# Patient Record
Sex: Male | Born: 1990 | Race: Black or African American | Hispanic: No | Marital: Single | Smoking: Former smoker
Health system: Southern US, Community
[De-identification: ages and names within clinical notes are randomized; demographics above are authoritative.]

## PROBLEM LIST (undated history)

## (undated) DIAGNOSIS — F32A Depression, unspecified: Secondary | ICD-10-CM

## (undated) DIAGNOSIS — F3132 Bipolar disorder, current episode depressed, moderate: Secondary | ICD-10-CM

## (undated) DIAGNOSIS — R45851 Suicidal ideations: Secondary | ICD-10-CM

## (undated) DIAGNOSIS — F419 Anxiety disorder, unspecified: Secondary | ICD-10-CM

## (undated) DIAGNOSIS — F329 Major depressive disorder, single episode, unspecified: Secondary | ICD-10-CM

## (undated) DIAGNOSIS — F609 Personality disorder, unspecified: Secondary | ICD-10-CM

## (undated) DIAGNOSIS — F319 Bipolar disorder, unspecified: Secondary | ICD-10-CM

## (undated) DIAGNOSIS — R51 Headache: Secondary | ICD-10-CM

## (undated) DIAGNOSIS — G8929 Other chronic pain: Secondary | ICD-10-CM

## (undated) DIAGNOSIS — M549 Dorsalgia, unspecified: Secondary | ICD-10-CM

## (undated) HISTORY — DX: Major depressive disorder, single episode, unspecified: F32.9

## (undated) HISTORY — DX: Depression, unspecified: F32.A

## (undated) HISTORY — DX: Bipolar disorder, current episode depressed, moderate: F31.32

## (undated) HISTORY — PX: ESOPHAGUS SURGERY: SHX626

## (undated) HISTORY — DX: Anxiety disorder, unspecified: F41.9

## (undated) HISTORY — DX: Suicidal ideations: R45.851

## (undated) HISTORY — DX: Headache: R51

## (undated) HISTORY — PX: RIB FRACTURE SURGERY: SHX2358

---

## 2011-02-03 ENCOUNTER — Emergency Department (HOSPITAL_COMMUNITY)
Admission: EM | Admit: 2011-02-03 | Discharge: 2011-02-04 | Disposition: A | Discharge status: Home / Self Care | Attending: Emergency Medicine | Admitting: Emergency Medicine

## 2011-02-03 DIAGNOSIS — F17200 Nicotine dependence, unspecified, uncomplicated: Secondary | ICD-10-CM | POA: Insufficient documentation

## 2011-02-03 DIAGNOSIS — J45909 Unspecified asthma, uncomplicated: Secondary | ICD-10-CM | POA: Insufficient documentation

## 2011-02-03 DIAGNOSIS — J029 Acute pharyngitis, unspecified: Secondary | ICD-10-CM

## 2011-02-03 LAB — RAPID STREP SCREEN (MED CTR MEBANE ONLY): Streptococcus, Group A Screen (Direct): NEGATIVE

## 2011-02-03 MED ORDER — TRAMADOL HCL 50 MG PO TABS: 100.0000 mg | ORAL_TABLET | Freq: Four times a day (QID) | ORAL | Status: DC

## 2011-02-03 MED ORDER — IBUPROFEN 800 MG PO TABS
800.0000 mg | ORAL_TABLET | Freq: Once | ORAL | Status: AC
  Administered 2011-02-03: 800 mg via ORAL
  Filled 2011-02-03: qty 1

## 2011-02-03 MED ORDER — TRAMADOL HCL 50 MG PO TABS: 100.0000 mg | ORAL_TABLET | Freq: Four times a day (QID) | ORAL | Status: AC

## 2011-02-03 MED ORDER — TRAMADOL HCL 50 MG PO TABS
100.0000 mg | ORAL_TABLET | Freq: Four times a day (QID) | ORAL | Status: DC
  Administered 2011-02-04: 100 mg via ORAL
  Filled 2011-02-03: qty 2

## 2011-02-03 NOTE — ED Notes (Signed)
Patient is resting comfortably. 

## 2011-02-03 NOTE — ED Provider Notes (Signed)
History     Chief Complaint  Patient presents with  . Sore Throat   Patient is a 20 y.o. male presenting with pharyngitis. The history is provided by the patient. No language interpreter was used.  Sore Throat This is a new problem. The current episode started in the past 7 days. The problem occurs constantly. The problem has been gradually worsening. Associated symptoms include a sore throat. The symptoms are aggravated by swallowing. He has tried nothing for the symptoms.  Sore Throat This is a new problem. The current episode started in the past 7 days. The problem occurs constantly. The problem has been gradually worsening. The symptoms are aggravated by swallowing. He has tried nothing for the symptoms.    Past Medical History  Diagnosis Date  . Asthma     History reviewed. No pertinent past surgical history.  History reviewed. No pertinent family history.  History  Substance Use Topics  . Smoking status: Current Everyday Smoker  . Smokeless tobacco: Not on file  . Alcohol Use: No      Review of Systems  HENT: Positive for sore throat.   All other systems reviewed and are negative.    Physical Exam  BP 97/60  Pulse 98  Temp(Src) 101 F (38.3 C) (Oral)  Resp 17  Ht 5\' 10"  (1.778 m)  Wt 130 lb (58.968 kg)  BMI 18.65 kg/m2  SpO2 96%  Physical Exam  Nursing note and vitals reviewed. Constitutional: He is oriented to person, place, and time. Vital signs are normal. He appears well-developed and well-nourished.  HENT:  Head: Normocephalic and atraumatic.  Right Ear: External ear normal.  Left Ear: External ear normal.  Nose: Nose normal.  Mouth/Throat: Uvula is midline and mucous membranes are normal. No oropharyngeal exudate, posterior oropharyngeal edema, posterior oropharyngeal erythema or tonsillar abscesses.  Eyes: Conjunctivae and EOM are normal. Pupils are equal, round, and reactive to light. Right eye exhibits no discharge. Left eye exhibits no  discharge. No scleral icterus.  Neck: Normal range of motion. Neck supple. No JVD present. No tracheal deviation present. No thyromegaly present.  Cardiovascular: Normal rate, regular rhythm, normal heart sounds, intact distal pulses and normal pulses.  Exam reveals no gallop and no friction rub.   No murmur heard. Pulmonary/Chest: Effort normal and breath sounds normal. No stridor. No respiratory distress. He has no wheezes. He has no rales. He exhibits no tenderness.  Abdominal: Soft. Normal appearance and bowel sounds are normal. He exhibits no distension and no mass. There is no tenderness. There is no rebound and no guarding.  Musculoskeletal: Normal range of motion. He exhibits no edema and no tenderness.  Lymphadenopathy:    He has no cervical adenopathy.  Neurological: He is alert and oriented to person, place, and time. He has normal reflexes. No cranial nerve deficit. Coordination normal. GCS eye subscore is 4. GCS verbal subscore is 5. GCS motor subscore is 6.  Reflex Scores:      Tricep reflexes are 2+ on the right side and 2+ on the left side.      Bicep reflexes are 2+ on the right side and 2+ on the left side.      Brachioradialis reflexes are 2+ on the right side and 2+ on the left side.      Patellar reflexes are 2+ on the right side and 2+ on the left side.      Achilles reflexes are 2+ on the right side and 2+ on the left side.  Skin: Skin is warm and dry. No rash noted. He is not diaphoretic.  Psychiatric: He has a normal mood and affect. His speech is normal and behavior is normal. Judgment and thought content normal. Cognition and memory are normal.    ED Course  Procedures  MDM       Worthy Rancher, PA 02/12/11 1634  Worthy Rancher, PA 02/12/11 1635  Medical screening examination/treatment/procedure(s) were performed by non-physician practitioner and as supervising physician I was immediately available for consultation/collaboration.  Nicoletta Dress. Colon Branch,  MD 02/17/11 [phone]

## 2011-02-03 NOTE — ED Notes (Signed)
Sore throat x 2 days

## 2011-06-08 ENCOUNTER — Other Ambulatory Visit (INDEPENDENT_AMBULATORY_CARE_PROVIDER_SITE_OTHER)

## 2011-06-08 ENCOUNTER — Encounter: Payer: Self-pay | Admitting: Internal Medicine

## 2011-06-08 ENCOUNTER — Ambulatory Visit (INDEPENDENT_AMBULATORY_CARE_PROVIDER_SITE_OTHER): Admitting: Internal Medicine

## 2011-06-08 VITALS — BP 112/78 | HR 63 | Temp 98.4°F | Resp 16 | Ht 70.0 in | Wt 134.0 lb

## 2011-06-08 DIAGNOSIS — Z0000 Encounter for general adult medical examination without abnormal findings: Secondary | ICD-10-CM

## 2011-06-08 DIAGNOSIS — Z23 Encounter for immunization: Secondary | ICD-10-CM

## 2011-06-08 LAB — CBC WITH DIFFERENTIAL/PLATELET
Basophils Relative: 0.3 % (ref 0.0–3.0)
Eosinophils Absolute: 0.4 10*3/uL (ref 0.0–0.7)
MCHC: 33.5 g/dL (ref 30.0–36.0)
MCV: 85.2 fl (ref 78.0–100.0)
Monocytes Absolute: 1 10*3/uL (ref 0.1–1.0)
Neutrophils Relative %: 75.6 % (ref 43.0–77.0)
Platelets: 268 10*3/uL (ref 150.0–400.0)
RDW: 13.2 % (ref 11.5–14.6)

## 2011-06-08 LAB — URINALYSIS, ROUTINE W REFLEX MICROSCOPIC
Bilirubin Urine: NEGATIVE
Nitrite: NEGATIVE
Total Protein, Urine: NEGATIVE
pH: 7 (ref 5.0–8.0)

## 2011-06-08 LAB — COMPREHENSIVE METABOLIC PANEL
AST: 32 U/L (ref 0–37)
Albumin: 4.5 g/dL (ref 3.5–5.2)
Alkaline Phosphatase: 103 U/L (ref 39–117)
BUN: 12 mg/dL (ref 6–23)
Potassium: 4.6 mEq/L (ref 3.5–5.1)
Sodium: 142 mEq/L (ref 135–145)

## 2011-06-08 LAB — LIPID PANEL
Cholesterol: 171 mg/dL (ref 0–200)
HDL: 60.4 mg/dL (ref >39.00)
VLDL: 10.4 mg/dL (ref 0.0–40.0)

## 2011-06-08 NOTE — Assessment & Plan Note (Signed)
Exam done, labs ordered, vaccines were updated, pt ed material was given 

## 2011-06-08 NOTE — Progress Notes (Signed)
  Subjective:    Patient ID: Robert Benton, male    DOB: [birth year], 20 y.o.   MRN: [id number]  HPI  New to me for a complete physical, he offers no complaints.  Review of Systems  Constitutional: Negative.   HENT: Negative.   Eyes: Negative.   Respiratory: Negative.   Cardiovascular: Negative.   Gastrointestinal: Negative.   Genitourinary: Negative.   Musculoskeletal: Negative.   Skin: Negative.   Neurological: Negative.   Hematological: Negative.   Psychiatric/Behavioral: Negative.        Objective:   Physical Exam  Vitals reviewed. Constitutional: He is oriented to person, place, and time. He appears well-developed and well-nourished. No distress.  HENT:  Head: Normocephalic and atraumatic.  Mouth/Throat: Oropharynx is clear and moist. No oropharyngeal exudate.  Eyes: Conjunctivae are normal. Right eye exhibits no discharge. Left eye exhibits no discharge. No scleral icterus.  Neck: Normal range of motion. Neck supple. No JVD present. No tracheal deviation present. No thyromegaly present.  Cardiovascular: Normal rate, regular rhythm, normal heart sounds and intact distal pulses.  Exam reveals no gallop and no friction rub.   No murmur heard. Pulmonary/Chest: Effort normal and breath sounds normal. No stridor. No respiratory distress. He has no wheezes. He has no rales. He exhibits no tenderness.  Abdominal: Soft. Bowel sounds are normal. He exhibits no distension and no mass. There is no tenderness. There is no rebound and no guarding. Hernia confirmed negative in the right inguinal area and confirmed negative in the left inguinal area.  Genitourinary: Testes normal and penis normal. Right testis shows no mass, no swelling and no tenderness. Right testis is descended. Left testis shows no mass, no swelling and no tenderness. Left testis is descended. Circumcised. No penile tenderness. No discharge found.  Musculoskeletal: Normal range of motion. He exhibits no edema and no  tenderness.  Lymphadenopathy:    He has no cervical adenopathy.       Right: No inguinal adenopathy present.       Left: No inguinal adenopathy present.  Neurological: He is oriented to person, place, and time.  Skin: Skin is warm and dry. No rash noted. He is not diaphoretic. No erythema. No pallor.  Psychiatric: He has a normal mood and affect. His behavior is normal. Judgment and thought content normal.          Assessment & Plan:

## 2011-06-08 NOTE — Patient Instructions (Signed)
Health Maintenance, Males A healthy lifestyle and preventative care can promote health and wellness.  Maintain regular health, dental, and eye exams.   Eat a healthy diet. Foods like vegetables, fruits, whole grains, low-fat dairy products, and lean protein foods contain the nutrients you need without too many calories. Decrease your intake of foods high in solid fats, added sugars, and salt. Get information about a proper diet from your caregiver, if necessary.   Regular physical exercise is one of the most important things you can do for your health. Most adults should get at least 150 minutes of moderate-intensity exercise (any activity that increases your heart rate and causes you to sweat) each week. In addition, most adults need muscle-strengthening exercises on 2 or more days a week.    Maintain a healthy weight. The body mass index (BMI) is a screening tool to identify possible weight problems. It provides an estimate of body fat based on height and weight. Your caregiver can help determine your BMI, and can help you achieve or maintain a healthy weight. For adults 20 years and older:   A BMI below 18.5 is considered underweight.   A BMI of 18.5 to 24.9 is normal.   A BMI of 25 to 29.9 is considered overweight.   A BMI of 30 and above is considered obese.   Maintain normal blood lipids and cholesterol by exercising and minimizing your intake of saturated fat. Eat a balanced diet with plenty of fruits and vegetables. Blood tests for lipids and cholesterol should begin at age 20 and be repeated every 5 years. If your lipid or cholesterol levels are high, you are over 50, or you are a high risk for heart disease, you may need your cholesterol levels checked more frequently.Ongoing high lipid and cholesterol levels should be treated with medicines, if diet and exercise are not effective.   If you smoke, find out from your caregiver how to quit. If you do not use tobacco, do not start.    If you choose to drink alcohol, do not exceed 2 drinks per day. One drink is considered to be 12 ounces (355 mL) of beer, 5 ounces (148 mL) of wine, or 1.5 ounces (44 mL) of liquor.   Avoid use of street drugs. Do not share needles with anyone. Ask for help if you need support or instructions about stopping the use of drugs.   High blood pressure causes heart disease and increases the risk of stroke. Blood pressure should be checked at least every 1 to 2 years. Ongoing high blood pressure should be treated with medicines if weight loss and exercise are not effective.   If you are 45 to 19 years old, ask your caregiver if you should take aspirin to prevent heart disease.   Diabetes screening involves taking a blood sample to check your fasting blood sugar level. This should be done once every 3 years, after age 45, if you are within normal weight and without risk factors for diabetes. Testing should be considered at a younger age or be carried out more frequently if you are overweight and have at least 1 risk factor for diabetes.   Colorectal cancer can be detected and often prevented. Most routine colorectal cancer screening begins at the age of 50 and continues through age 75. However, your caregiver may recommend screening at an earlier age if you have risk factors for colon cancer. On a yearly basis, your caregiver may provide home test kits to check for hidden   blood in the stool. Use of a small camera at the end of a tube, to directly examine the colon (sigmoidoscopy or colonoscopy), can detect the earliest forms of colorectal cancer. Talk to your caregiver about this at age 50, when routine screening begins. Direct examination of the colon should be repeated every 5 to 10 years through age 75, unless early forms of pre-cancerous polyps or small growths are found.   Healthy men should no longer receive prostate-specific antigen (PSA) blood tests as part of routine cancer screening. Consult with  your caregiver about prostate cancer screening.   Practice safe sex. Use condoms and avoid high-risk sexual practices to reduce the spread of sexually transmitted infections (STIs).   Use sunscreen with a sun protection factor (SPF) of 30 or greater. Apply sunscreen liberally and repeatedly throughout the day. You should seek shade when your shadow is shorter than you. Protect yourself by wearing long sleeves, pants, a wide-brimmed hat, and sunglasses year round, whenever you are outdoors.   Notify your caregiver of new moles or changes in moles, especially if there is a change in shape or color. Also notify your caregiver if a mole is larger than the size of a pencil eraser.   A one-time screening for abdominal aortic aneurysm (AAA) and surgical repair of large AAAs by sound wave imaging (ultrasonography) is recommended for ages 65 to 75 years who are current or former smokers.   Stay current with your immunizations.  Document Released: 01/05/2008 Document Revised: 03/21/2011 Document Reviewed: 12/04/2010 [employer] Patient Information 2012 [employer]. 

## 2011-06-10 ENCOUNTER — Encounter: Payer: Self-pay | Admitting: Internal Medicine

## 2011-06-11 ENCOUNTER — Telehealth: Payer: Self-pay

## 2011-06-11 NOTE — Telephone Encounter (Signed)
What is the diagnosis? Is there an option for this in the EPIC software?

## 2011-06-11 NOTE — Telephone Encounter (Signed)
Patient stopped by office requesting referral to [facility]. Thanks

## 2011-06-11 NOTE — Telephone Encounter (Signed)
Returned call to patient to advise pt appt must be selfmade

## 2012-11-23 ENCOUNTER — Encounter (HOSPITAL_COMMUNITY): Payer: Self-pay

## 2012-11-23 ENCOUNTER — Emergency Department (HOSPITAL_COMMUNITY)
Admission: EM | Admit: 2012-11-23 | Discharge: 2012-11-23 | Disposition: A | Discharge status: Home / Self Care | Payer: Self-pay | Attending: Emergency Medicine | Admitting: Emergency Medicine

## 2012-11-23 ENCOUNTER — Emergency Department (HOSPITAL_COMMUNITY): Payer: Self-pay

## 2012-11-23 DIAGNOSIS — Y9289 Other specified places as the place of occurrence of the external cause: Secondary | ICD-10-CM | POA: Insufficient documentation

## 2012-11-23 DIAGNOSIS — M549 Dorsalgia, unspecified: Secondary | ICD-10-CM | POA: Insufficient documentation

## 2012-11-23 DIAGNOSIS — Y9389 Activity, other specified: Secondary | ICD-10-CM | POA: Insufficient documentation

## 2012-11-23 DIAGNOSIS — R296 Repeated falls: Secondary | ICD-10-CM | POA: Insufficient documentation

## 2012-11-23 DIAGNOSIS — F17200 Nicotine dependence, unspecified, uncomplicated: Secondary | ICD-10-CM | POA: Insufficient documentation

## 2012-11-23 DIAGNOSIS — R55 Syncope and collapse: Secondary | ICD-10-CM | POA: Insufficient documentation

## 2012-11-23 DIAGNOSIS — Z8679 Personal history of other diseases of the circulatory system: Secondary | ICD-10-CM | POA: Insufficient documentation

## 2012-11-23 DIAGNOSIS — S0990XA Unspecified injury of head, initial encounter: Secondary | ICD-10-CM | POA: Insufficient documentation

## 2012-11-23 DIAGNOSIS — J45909 Unspecified asthma, uncomplicated: Secondary | ICD-10-CM | POA: Insufficient documentation

## 2012-11-23 DIAGNOSIS — Z8659 Personal history of other mental and behavioral disorders: Secondary | ICD-10-CM | POA: Insufficient documentation

## 2012-11-23 LAB — CBC WITH DIFFERENTIAL/PLATELET
Hemoglobin: 15.2 g/dL (ref 13.0–17.0)
Lymphocytes Relative: 26 % (ref 12–46)
Lymphs Abs: 3.1 10*3/uL (ref 0.7–4.0)
MCH: 28.8 pg (ref 26.0–34.0)
Monocytes Relative: 6 % (ref 3–12)
Neutro Abs: 7.4 10*3/uL (ref 1.7–7.7)
Neutrophils Relative %: 62 % (ref 43–77)
RBC: 5.28 MIL/uL (ref 4.22–5.81)
WBC: 12 10*3/uL — ABNORMAL HIGH (ref 4.0–10.5)

## 2012-11-23 LAB — COMPREHENSIVE METABOLIC PANEL
ALT: 36 U/L (ref 0–53)
Alkaline Phosphatase: 98 U/L (ref 39–117)
BUN: 10 mg/dL (ref 6–23)
CO2: 28 mEq/L (ref 19–32)
Chloride: 103 mEq/L (ref 96–112)
GFR calc Af Amer: >90 mL/min (ref >90)
Glucose, Bld: 92 mg/dL (ref 70–99)
Potassium: 3.9 mEq/L (ref 3.5–5.1)
Total Bilirubin: 0.3 mg/dL (ref 0.3–1.2)

## 2012-11-23 LAB — URINALYSIS, ROUTINE W REFLEX MICROSCOPIC
Bilirubin Urine: NEGATIVE
Ketones, ur: NEGATIVE mg/dL
Leukocytes, UA: NEGATIVE
Nitrite: NEGATIVE
Protein, ur: NEGATIVE mg/dL

## 2012-11-23 LAB — RAPID URINE DRUG SCREEN, HOSP PERFORMED
Amphetamines: NOT DETECTED
Barbiturates: NOT DETECTED

## 2012-11-23 LAB — TROPONIN I: Troponin I: <0.3 ng/mL (ref <0.30)

## 2012-11-23 MED ORDER — SODIUM CHLORIDE 0.9 % IV BOLUS (SEPSIS)
1000.0000 mL | Freq: Once | INTRAVENOUS | Status: AC
  Administered 2012-11-23: 1000 mL via INTRAVENOUS

## 2012-11-23 NOTE — ED Notes (Signed)
Pt presents with c/o dizziness with LOC, and lower back pain that radiates upward to mid thoracic area.  Pt refers to the LOC as, room spins, feels anxious, difficulty breathing  " then passes out". Awakens on the floor. Pt reports has a lot of stress in his life right now. NAD noted at this time. Pt is alert and oriented x 4.

## 2012-11-23 NOTE — ED Notes (Signed)
1. Dizziness started on Thursday, stood and fell "i passed out then and fell", stated he laid on floor for 10 minutes and then got up and drank some water.  Was not seen at that time.  Headache since falling, +loc per pt, denies any n/v since fall. 2. Back pain for 2 years, has been seen by multiple doctors. Denies any new or recent injury to back.

## 2012-11-23 NOTE — ED Provider Notes (Signed)
History  This chart was scribed for Robert Octave, MD by Robert Benton, ED Scribe. The patient was seen in room APA14/APA14. Patient's care was started at 1425.   CSN: [id number]  Arrival date & time 11/23/12  1402   First MD Initiated Contact with Patient 11/23/12 1425      Chief Complaint  Patient presents with  . Dizziness  . Back Pain  . Headache     The history is provided by the patient. No language interpreter was used.     HPI Comments: Robert Benton is a 22 y.o. male who presents to the Emergency Department complaining of persistent dizziness since Thursday (3 days ago). He describes the sensation both as like the room is spinning and like he is off balance at different times. Patient also states that he had a brief syncopal episode on Thursday. He reports that he fell on the floor and laid there for 10 minutes before getting up and having some water. He says that he has had a diffuse headache since falling. Patient was not evaluated immediately after the fall. He states that he came to the ED today because he has continued to feel ill. Patient denies nausea, chest pain, shortness of breath, abdominal pain, dysuria, decreased intake or other symptoms. Patient states that he is eating and drinking well. Patient denies history of cardiac disease. He reports family history of MI (uncles in 30s). He states that one of his cousins also died suddenly. He smokes cigarettes and uses marijuana. His last marijuana use was one week ago.  Past Medical History  Diagnosis Date  . Asthma   . Depression   . Headache     Migranes    History reviewed. No pertinent past surgical history.  Family History  Problem Relation Age of Onset  . Stroke Paternal Grandfather     grandparents  . Heart disease Other     grandparents  . Hypertension Other     grandparents and other relative  . Kidney disease Other     grandparents  . Diabetes Other     grandparents  . Asthma Other      grandparents  . Cancer Neg Hx     History  Substance Use Topics  . Smoking status: Current Every Day Smoker    Types: Cigarettes  . Smokeless tobacco: Not on file  . Alcohol Use: No      Review of Systems A complete 10 system review of systems was obtained and all systems are negative except as noted in the HPI and PMH.   Allergies  Review of patient's allergies indicates no known allergies.  Home Medications  No current outpatient prescriptions on file.  Triage Vitals: BP 136/87  Pulse 88  Temp(Src) 98.2 F (36.8 C) (Oral)  Resp 17  Ht 5\' 10"  (1.778 m)  Wt 134 lb (60.782 kg)  BMI 19.23 kg/m2  SpO2 97%  Physical Exam  Constitutional: He appears well-developed and well-nourished.  HENT:  Head: Normocephalic and atraumatic.  Mouth/Throat: Oropharynx is clear and moist. Mucous membranes are dry.  Eyes: Conjunctivae and EOM are normal. Pupils are equal, round, and reactive to light.  No nystagmus.   Neck: Normal range of motion. Neck supple.  Cardiovascular: Normal rate, regular rhythm and normal heart sounds.   Pulmonary/Chest: Effort normal and breath sounds normal.  Abdominal: Soft. There is no tenderness.  Musculoskeletal: Normal range of motion.  Neurological: No cranial nerve deficit. He exhibits normal muscle tone. Coordination normal.  5/5 strength throughout.  Skin: Skin is warm and dry.  Psychiatric: He has a normal mood and affect. His behavior is normal.    ED Course  Procedures (including critical care time) DIAGNOSTIC STUDIES: Oxygen Saturation is 97% on room air, adequate by my interpretation.    COORDINATION OF CARE: 2:27 PM- Patient informed of current plan for treatment and evaluation and agrees with plan at this time.      Labs Reviewed  CBC WITH DIFFERENTIAL - Abnormal; Notable for the following:    WBC 12.0 (*)    MCHC 36.5 (*)    Eosinophils Relative 6 (*)    All other components within normal limits  URINE RAPID DRUG SCREEN (HOSP  PERFORMED) - Abnormal; Notable for the following:    Tetrahydrocannabinol POSITIVE (*)    All other components within normal limits  COMPREHENSIVE METABOLIC PANEL  URINALYSIS, ROUTINE W REFLEX MICROSCOPIC  TROPONIN I   Ct Head Wo Contrast  11/23/2012  *RADIOLOGY REPORT*  Clinical Data: Dizziness with headaches for a few days.  CT HEAD WITHOUT CONTRAST  Technique:  Contiguous axial images were obtained from the base of the skull through the vertex without contrast.  Comparison: None.  Findings: There is no evidence of acute intracranial hemorrhage, mass lesion, brain edema or extra-axial fluid collection.  The ventricles and subarachnoid spaces are appropriately sized for age. There is no CT evidence of acute cortical infarction.  The visualized paranasal sinuses are clear. The calvarium is intact.  IMPRESSION: Unremarkable noncontrast head CT.   Original Report Authenticated By: Robert Benton, M.D.      1. Syncope       MDM  3 days of dizziness and lightheadedness with syncopal episode 3 years ago. Uncertain loss of consciousness. No nausea, vomiting, diarrhea. No chest pain shortness of breath. No palpitations.  EKG showed normal sinus rhythm without arrhythmia. Orthostatics are negative. Laboratories unremarkable.  Patient is at his baseline, tolerating by mouth ambulatory. Suspect probable vasovagal syncope. He is low risk for adverse outcome. [place] syncope rules negative. Advised to stop smoking marijuana.   Date: 11/23/2012  Rate: 84  Rhythm: normal sinus rhythm  QRS Axis: normal  Intervals: normal  ST/T Wave abnormalities: normal  Conduction Disutrbances:none  Narrative Interpretation: No brugada, no prolonged QT  Old EKG Reviewed: none available       I personally performed the services described in this documentation, which was scribed in my presence. The recorded information has been reviewed and is accurate.    Robert Octave, MD 11/23/12 [phone]

## 2012-11-26 ENCOUNTER — Encounter (HOSPITAL_COMMUNITY): Payer: Self-pay

## 2012-11-26 ENCOUNTER — Emergency Department (HOSPITAL_COMMUNITY)
Admission: EM | Admit: 2012-11-26 | Discharge: 2012-11-26 | Disposition: A | Discharge status: Home / Self Care | Payer: Self-pay | Attending: Emergency Medicine | Admitting: Emergency Medicine

## 2012-11-26 DIAGNOSIS — Z79899 Other long term (current) drug therapy: Secondary | ICD-10-CM | POA: Insufficient documentation

## 2012-11-26 DIAGNOSIS — M545 Low back pain, unspecified: Secondary | ICD-10-CM | POA: Insufficient documentation

## 2012-11-26 DIAGNOSIS — J40 Bronchitis, not specified as acute or chronic: Secondary | ICD-10-CM

## 2012-11-26 DIAGNOSIS — R079 Chest pain, unspecified: Secondary | ICD-10-CM | POA: Insufficient documentation

## 2012-11-26 DIAGNOSIS — Z8659 Personal history of other mental and behavioral disorders: Secondary | ICD-10-CM | POA: Insufficient documentation

## 2012-11-26 DIAGNOSIS — J3489 Other specified disorders of nose and nasal sinuses: Secondary | ICD-10-CM | POA: Insufficient documentation

## 2012-11-26 DIAGNOSIS — F17200 Nicotine dependence, unspecified, uncomplicated: Secondary | ICD-10-CM | POA: Insufficient documentation

## 2012-11-26 DIAGNOSIS — G8929 Other chronic pain: Secondary | ICD-10-CM | POA: Insufficient documentation

## 2012-11-26 DIAGNOSIS — Z8679 Personal history of other diseases of the circulatory system: Secondary | ICD-10-CM | POA: Insufficient documentation

## 2012-11-26 DIAGNOSIS — J45909 Unspecified asthma, uncomplicated: Secondary | ICD-10-CM | POA: Insufficient documentation

## 2012-11-26 MED ORDER — ALBUTEROL SULFATE HFA 108 (90 BASE) MCG/ACT IN AERS
2.0000 | INHALATION_SPRAY | RESPIRATORY_TRACT | Status: DC | PRN
  Administered 2012-11-26: 2 via RESPIRATORY_TRACT
  Filled 2012-11-26: qty 6.7

## 2012-11-26 MED ORDER — GUAIFENESIN-CODEINE 100-10 MG/5ML PO SYRP: 5.0000 mL | ORAL_SOLUTION | Freq: Three times a day (TID) | ORAL | Status: DC | PRN

## 2012-11-26 NOTE — ED Notes (Signed)
Pt reports has had a cough and chest pain x 2 days.  Also reports has chronic lower back pain.  Pt says upper back also hurts.  Pt also says has been having a lot anxiety lately.

## 2012-11-26 NOTE — ED Notes (Signed)
Patient states that he has been having chest tightness with a dry cough for the past 2 days.  States that his symptoms do feel similar to anxiety attacks that he has had in the past.  The patient reports being admitted for psychiatric problems in 2011 with no follow up since then.  Reports that he was taking trileptal then and felt much better, however, the patient reports that he was not directed to follow up with a doctor for continued psychiatric care.

## 2012-11-26 NOTE — ED Notes (Signed)
Respiratory therapy paged for treatment 

## 2012-11-26 NOTE — ED Provider Notes (Signed)
History     CSN: [id number]  Arrival date & time 11/26/12  4098   First MD Initiated Contact with Patient 11/26/12 0930      Chief Complaint  Patient presents with  . Cough  . Back Pain    (Consider location/radiation/quality/duration/timing/severity/associated sxs/prior treatment) Patient is a 22 y.o. male presenting with cough and back pain. The history is provided by the patient.  Cough Cough characteristics:  Non-productive Severity:  Moderate Onset quality:  Gradual Duration:  2 days Timing:  Sporadic Progression:  Unchanged Chronicity:  New Smoker: yes   Relieved by:  Rest Ineffective treatments:  None tried Associated symptoms: no chills, no ear pain, no fever, no headaches, no myalgias, no rash, no sinus congestion and no sore throat  Chest pain: only with cough.   Back Pain Associated symptoms: no fever and no headaches  Chest pain: only with cough.    Robert Benton is a 22 y.o. male who presents to the ED with cough and cold symptoms that started 2 days ago. He has not tried any OTC medication. His chest hurts after he has coughed a lot. Hx of asthma as a child. Symptoms worse since pollen is worse. He denies fever, chills, nausea or vomiting. He was here last week and had complete work up for syncopal episodes. He is going to follow up with Dr. Loleta Chance as planned. The history was provided by the patient and his mother. Past Medical History  Diagnosis Date  . Asthma   . Depression   . Headache     Migranes    History reviewed. No pertinent past surgical history.  Family History  Problem Relation Age of Onset  . Stroke Paternal Grandfather     grandparents  . Heart disease Other     grandparents  . Hypertension Other     grandparents and other relative  . Kidney disease Other     grandparents  . Diabetes Other     grandparents  . Asthma Other     grandparents  . Cancer Neg Hx     History  Substance Use Topics  . Smoking status: Current Every Day  Smoker    Types: Cigarettes  . Smokeless tobacco: Not on file  . Alcohol Use: No      Review of Systems  Constitutional: Negative for fever and chills.  HENT: Negative for ear pain and sore throat.   Respiratory: Positive for cough.   Cardiovascular: Chest pain: only with cough.  Gastrointestinal: Negative for nausea and vomiting.  Musculoskeletal: Positive for back pain. Negative for myalgias.  Skin: Negative for rash.  Neurological: Negative for headaches.  Psychiatric/Behavioral: The patient is not nervous/anxious.     Allergies  Review of patient's allergies indicates no known allergies.  Home Medications   Current Outpatient Rx  Name  Route  Sig  Dispense  Refill  . albuterol (PROVENTIL HFA;VENTOLIN HFA) 108 (90 BASE) MCG/ACT inhaler   Inhalation   Inhale 2 puffs into the lungs every 6 (six) hours as needed for wheezing.           BP 128/91  Temp(Src) 98.2 F (36.8 C) (Oral)  Resp 18  Ht 5\' 10"  (1.778 m)  Wt 134 lb (60.782 kg)  BMI 19.23 kg/m2  SpO2 99%  Physical Exam  Constitutional: He is oriented to person, place, and time. He appears well-developed and well-nourished. No distress.  HENT:  Head: Normocephalic.  Right Ear: Tympanic membrane normal.  Left Ear: Tympanic membrane normal.  Nose: Rhinorrhea present. Right sinus exhibits no maxillary sinus tenderness and no frontal sinus tenderness. Left sinus exhibits no maxillary sinus tenderness and no frontal sinus tenderness.  Mouth/Throat: Uvula is midline and mucous membranes are normal. Posterior oropharyngeal erythema: mild.  Eyes: Conjunctivae and EOM are normal. Pupils are equal, round, and reactive to light.  Neck: Normal range of motion. Neck supple.  Cardiovascular: Normal rate, regular rhythm and normal heart sounds.   Pulmonary/Chest: Effort normal. Wheezes: occasional. He exhibits tenderness (with palpation).  Abdominal: Soft. Bowel sounds are normal. There is no tenderness.  Musculoskeletal:  Normal range of motion.  Neurological: He is alert and oriented to person, place, and time.  Skin: Skin is warm and dry.  Psychiatric: He has a normal mood and affect. His behavior is normal. Judgment and thought content normal.   Assessment: 22 y.o. male with nonproductive cough   Bronchospasm  Plan:  Cough medication, albuterol inhaler   Follow up with PCP as planned, return as needed ED Course  Procedures (including critical care time) I have reviewed this patient's vital signs, nurses notes and discussed clinical findings with the patient and plan of care. Patient voices understanding   Medication List    TAKE these medications       guaiFENesin-codeine 100-10 MG/5ML syrup  Commonly known as:  ROBITUSSIN AC  Take 5 mLs by mouth 3 (three) times daily as needed for cough.      ASK your doctor about these medications       albuterol 108 (90 BASE) MCG/ACT inhaler  Commonly known as:  PROVENTIL HFA;VENTOLIN HFA  Inhale 2 puffs into the lungs every 6 (six) hours as needed for wheezing.         MDM          Janne Napoleon, NP 11/26/12 [phone]

## 2012-11-30 NOTE — ED Provider Notes (Signed)
Medical screening examination/treatment/procedure(s) were performed by non-physician practitioner and as supervising physician I was immediately available for consultation/collaboration.   Shelda Jakes, MD 11/30/12 [phone]

## 2012-12-24 ENCOUNTER — Emergency Department (HOSPITAL_COMMUNITY)
Admission: EM | Admit: 2012-12-24 | Discharge: 2012-12-24 | Disposition: A | Discharge status: Home / Self Care | Payer: Managed Care, Other (non HMO) | Attending: Emergency Medicine | Admitting: Emergency Medicine

## 2012-12-24 ENCOUNTER — Encounter (HOSPITAL_COMMUNITY): Payer: Self-pay | Admitting: Emergency Medicine

## 2012-12-24 DIAGNOSIS — J40 Bronchitis, not specified as acute or chronic: Secondary | ICD-10-CM

## 2012-12-24 DIAGNOSIS — J4530 Mild persistent asthma, uncomplicated: Secondary | ICD-10-CM

## 2012-12-24 DIAGNOSIS — Z79899 Other long term (current) drug therapy: Secondary | ICD-10-CM | POA: Insufficient documentation

## 2012-12-24 DIAGNOSIS — F17200 Nicotine dependence, unspecified, uncomplicated: Secondary | ICD-10-CM | POA: Insufficient documentation

## 2012-12-24 DIAGNOSIS — Z8679 Personal history of other diseases of the circulatory system: Secondary | ICD-10-CM | POA: Insufficient documentation

## 2012-12-24 DIAGNOSIS — Z8659 Personal history of other mental and behavioral disorders: Secondary | ICD-10-CM | POA: Insufficient documentation

## 2012-12-24 DIAGNOSIS — J45901 Unspecified asthma with (acute) exacerbation: Secondary | ICD-10-CM | POA: Insufficient documentation

## 2012-12-24 MED ORDER — ALBUTEROL SULFATE (5 MG/ML) 0.5% IN NEBU
5.0000 mg | INHALATION_SOLUTION | Freq: Once | RESPIRATORY_TRACT | Status: AC
  Administered 2012-12-24: 5 mg via RESPIRATORY_TRACT
  Filled 2012-12-24: qty 1

## 2012-12-24 MED ORDER — IPRATROPIUM BROMIDE 0.02 % IN SOLN
0.5000 mg | Freq: Once | RESPIRATORY_TRACT | Status: AC
  Administered 2012-12-24: 0.5 mg via RESPIRATORY_TRACT
  Filled 2012-12-24: qty 2.5

## 2012-12-24 MED ORDER — PREDNISONE 20 MG PO TABS: 60.0000 mg | ORAL_TABLET | Freq: Every day | ORAL | Status: DC

## 2012-12-24 MED ORDER — ALBUTEROL SULFATE HFA 108 (90 BASE) MCG/ACT IN AERS
2.0000 | INHALATION_SPRAY | RESPIRATORY_TRACT | Status: DC | PRN
Start: 2012-12-24 — End: 2012-12-24
  Administered 2012-12-24: 2 via RESPIRATORY_TRACT
  Filled 2012-12-24 (×2): qty 6.7

## 2012-12-24 MED ORDER — AMOXICILLIN 500 MG PO CAPS: 500.0000 mg | ORAL_CAPSULE | Freq: Three times a day (TID) | ORAL | Status: DC

## 2012-12-24 NOTE — ED Notes (Signed)
Pt c/o productive cough with yellowish colored sputum and sob since yesterday.  Pt says feels much worse today.  Reports is out of his inhaler.

## 2012-12-24 NOTE — ED Provider Notes (Signed)
History     CSN: [id number]  Arrival date & time 12/24/12  4098   First MD Initiated Contact with Patient 12/24/12 0703      Chief Complaint  Patient presents with  . Asthma  . Cough    (Consider location/radiation/quality/duration/timing/severity/associated sxs/prior treatment) HPI Comments: Patient presents to ER for evaluation of cough and wheezing. Patient has a history of asthma, does not currently have an inhaler. History of heart symptoms began yesterday, worsened overnight. He has not had a fever. Cough has been productive of yellow sputum.  Patient is a 22 y.o. male presenting with asthma and cough.  Asthma  Cough Associated symptoms: wheezing   Associated symptoms: no fever     Past Medical History  Diagnosis Date  . Asthma   . Depression   . Headache(784.0)     Migranes    History reviewed. No pertinent past surgical history.  Family History  Problem Relation Age of Onset  . Stroke Paternal Grandfather     grandparents  . Heart disease Other     grandparents  . Hypertension Other     grandparents and other relative  . Kidney disease Other     grandparents  . Diabetes Other     grandparents  . Asthma Other     grandparents  . Cancer Neg Hx     History  Substance Use Topics  . Smoking status: Current Every Day Smoker    Types: Cigarettes  . Smokeless tobacco: Not on file  . Alcohol Use: No      Review of Systems  Constitutional: Negative for fever.  Respiratory: Positive for cough and wheezing.   All other systems reviewed and are negative.    Allergies  Review of patient's allergies indicates no known allergies.  Home Medications   Current Outpatient Rx  Name  Route  Sig  Dispense  Refill  . albuterol (PROVENTIL HFA;VENTOLIN HFA) 108 (90 BASE) MCG/ACT inhaler   Inhalation   Inhale 2 puffs into the lungs every 6 (six) hours as needed for wheezing.         Marland Kitchen guaiFENesin-codeine (ROBITUSSIN AC) 100-10 MG/5ML syrup   Oral  Take 5 mLs by mouth 3 (three) times daily as needed for cough.   120 mL   0     BP 138/92  Pulse 80  Temp(Src) 98.3 F (36.8 C)  Resp 20  Ht 5\' 11"  (1.803 m)  Wt 132 lb (59.875 kg)  BMI 18.42 kg/m2  SpO2 93%  Physical Exam  Constitutional: He is oriented to person, place, and time. He appears well-developed and well-nourished. No distress.  HENT:  Head: Normocephalic and atraumatic.  Right Ear: Hearing normal.  Left Ear: Hearing normal.  Nose: Nose normal.  Mouth/Throat: Oropharynx is clear and moist and mucous membranes are normal.  Eyes: Conjunctivae and EOM are normal. Pupils are equal, round, and reactive to light.  Neck: Normal range of motion. Neck supple.  Cardiovascular: Regular rhythm, S1 normal and S2 normal.  Exam reveals no gallop and no friction rub.   No murmur heard. Pulmonary/Chest: Effort normal. No respiratory distress. He has wheezes. He exhibits no tenderness.  Abdominal: Soft. Normal appearance and bowel sounds are normal. There is no hepatosplenomegaly. There is no tenderness. There is no rebound, no guarding, no tenderness at McBurney's point and negative Murphy's sign. No hernia.  Musculoskeletal: Normal range of motion.  Neurological: He is alert and oriented to person, place, and time. He has normal strength. No cranial  nerve deficit or sensory deficit. Coordination normal. GCS eye subscore is 4. GCS verbal subscore is 5. GCS motor subscore is 6.  Skin: Skin is warm, dry and intact. No rash noted. No cyanosis.  Psychiatric: He has a normal mood and affect. His speech is normal and behavior is normal. Thought content normal.    ED Course  Procedures (including critical care time)  Labs Reviewed - No data to display No results found.   Diagnosis: 1. Asthma 2. Bronchitis   MDM  Patient presents to the ER with active bronchospasm. He has a history of asthma, currently is not using any bronchodilators. Patient improved with bronchodilator therapy  here in the ER. Patient appropriate for outpatient treatment asthma and bronchitis.       Gilda Crease, MD 12/24/12 [phone]

## 2012-12-24 NOTE — ED Notes (Signed)
Pt c/o productive cough with yellow sputum since yesterday and sob due to asthma. Pt states he is out of his albuterol inhaler. Pt ambulated to room from triage, no audible wheezing noted.

## 2013-01-14 DIAGNOSIS — J45901 Unspecified asthma with (acute) exacerbation: Secondary | ICD-10-CM | POA: Insufficient documentation

## 2013-01-14 DIAGNOSIS — J3489 Other specified disorders of nose and nasal sinuses: Secondary | ICD-10-CM | POA: Insufficient documentation

## 2013-01-14 DIAGNOSIS — Z79899 Other long term (current) drug therapy: Secondary | ICD-10-CM | POA: Insufficient documentation

## 2013-01-14 DIAGNOSIS — R05 Cough: Secondary | ICD-10-CM | POA: Insufficient documentation

## 2013-01-14 DIAGNOSIS — R059 Cough, unspecified: Secondary | ICD-10-CM | POA: Insufficient documentation

## 2013-01-14 DIAGNOSIS — Z8659 Personal history of other mental and behavioral disorders: Secondary | ICD-10-CM | POA: Insufficient documentation

## 2013-01-14 DIAGNOSIS — F17200 Nicotine dependence, unspecified, uncomplicated: Secondary | ICD-10-CM | POA: Insufficient documentation

## 2013-01-14 DIAGNOSIS — R0789 Other chest pain: Secondary | ICD-10-CM | POA: Insufficient documentation

## 2013-01-15 ENCOUNTER — Emergency Department (HOSPITAL_COMMUNITY): Payer: Managed Care, Other (non HMO)

## 2013-01-15 ENCOUNTER — Encounter (HOSPITAL_COMMUNITY): Payer: Self-pay | Admitting: Emergency Medicine

## 2013-01-15 ENCOUNTER — Emergency Department (HOSPITAL_COMMUNITY)
Admission: EM | Admit: 2013-01-15 | Discharge: 2013-01-15 | Disposition: A | Discharge status: Home / Self Care | Payer: Managed Care, Other (non HMO) | Attending: Emergency Medicine | Admitting: Emergency Medicine

## 2013-01-15 DIAGNOSIS — J45901 Unspecified asthma with (acute) exacerbation: Secondary | ICD-10-CM

## 2013-01-15 MED ORDER — PREDNISONE 50 MG PO TABS
60.0000 mg | ORAL_TABLET | Freq: Once | ORAL | Status: AC
  Administered 2013-01-15: 60 mg via ORAL
  Filled 2013-01-15: qty 1

## 2013-01-15 MED ORDER — ALBUTEROL SULFATE (5 MG/ML) 0.5% IN NEBU
5.0000 mg | INHALATION_SOLUTION | Freq: Once | RESPIRATORY_TRACT | Status: AC
  Administered 2013-01-15: 5 mg via RESPIRATORY_TRACT
  Filled 2013-01-15: qty 1

## 2013-01-15 MED ORDER — PREDNISONE 10 MG PO TABS: 20.0000 mg | ORAL_TABLET | Freq: Two times a day (BID) | ORAL | Status: DC

## 2013-01-15 MED ORDER — ALBUTEROL SULFATE (5 MG/ML) 0.5% IN NEBU
2.5000 mg | INHALATION_SOLUTION | Freq: Once | RESPIRATORY_TRACT | Status: AC
  Administered 2013-01-15: 2.5 mg via RESPIRATORY_TRACT
  Filled 2013-01-15: qty 0.5

## 2013-01-15 MED ORDER — ALBUTEROL SULFATE HFA 108 (90 BASE) MCG/ACT IN AERS
2.0000 | INHALATION_SPRAY | RESPIRATORY_TRACT | Status: DC | PRN
  Administered 2013-01-15: 2 via RESPIRATORY_TRACT
  Filled 2013-01-15: qty 6.7

## 2013-01-15 MED ORDER — PREDNISONE 50 MG PO TABS: 60.0000 mg | ORAL_TABLET | Freq: Once | ORAL | Status: DC

## 2013-01-15 MED ORDER — IPRATROPIUM BROMIDE 0.02 % IN SOLN
0.5000 mg | Freq: Once | RESPIRATORY_TRACT | Status: AC
  Administered 2013-01-15: 0.5 mg via RESPIRATORY_TRACT
  Filled 2013-01-15: qty 2.5

## 2013-01-15 NOTE — ED Provider Notes (Signed)
Medical screening examination/treatment/procedure(s) were performed by non-physician practitioner and as supervising physician I was immediately available for consultation/collaboration.   Dione Booze, MD 01/15/13 [phone]

## 2013-01-15 NOTE — ED Provider Notes (Signed)
History    CSN: [id number] Arrival date & time 01/14/13  2357  First MD Initiated Contact with Patient 01/15/13 0010     No chief complaint on file.  (Consider location/radiation/quality/duration/timing/severity/associated sxs/prior Treatment) Patient is a 22 y.o. male presenting with wheezing. The history is provided by the patient.  Wheezing Severity:  Moderate Severity compared to prior episodes:  More severe Onset quality:  Gradual Duration:  45 minutes Timing:  Constant Progression:  Worsening Chronicity:  Recurrent Relieved by:  Nothing Ineffective treatments:  None tried (ran out of inhailer) Associated symptoms: chest tightness, cough and shortness of breath   Associated symptoms: no ear pain, no fever, no headaches, no rash, no sore throat and no sputum production    Robert Benton is a 22 y.o. male who presents to the ED with asthma. Approximately 45 minutes prior to arrival to the ED he became short of breath with wheezing. He ran out of his inhaler. Last used his inhaler 2 weeks ago.   Past Medical History  Diagnosis Date  . Asthma   . Depression   . Headache(784.0)     Migranes   No past surgical history on file. Family History  Problem Relation Age of Onset  . Stroke Paternal Grandfather     grandparents  . Heart disease Other     grandparents  . Hypertension Other     grandparents and other relative  . Kidney disease Other     grandparents  . Diabetes Other     grandparents  . Asthma Other     grandparents  . Cancer Neg Hx    History  Substance Use Topics  . Smoking status: Current Every Day Smoker    Types: Cigarettes  . Smokeless tobacco: Not on file  . Alcohol Use: No    Review of Systems  Constitutional: Negative for fever and chills.  HENT: Positive for congestion. Negative for ear pain and sore throat.   Respiratory: Positive for cough, chest tightness, shortness of breath and wheezing. Negative for sputum production.    Gastrointestinal: Negative for nausea and vomiting.  Skin: Negative for rash.  Neurological: Negative for light-headedness and headaches.  Psychiatric/Behavioral: The patient is not nervous/anxious.    BP 133/87  Pulse 88  Temp(Src) 98.3 F (36.8 C) (Oral)  Resp 22  Ht 5\' 11"  (1.803 m)  Wt 135 lb (61.236 kg)  BMI 18.84 kg/m2  SpO2 94%  Allergies  Review of patient's allergies indicates no known allergies.  Home Medications   Current Outpatient Rx  Name  Route  Sig  Dispense  Refill  . albuterol (PROVENTIL HFA;VENTOLIN HFA) 108 (90 BASE) MCG/ACT inhaler   Inhalation   Inhale 2 puffs into the lungs every 6 (six) hours as needed for wheezing.         Marland Kitchen amoxicillin (AMOXIL) 500 MG capsule   Oral   Take 1 capsule (500 mg total) by mouth 3 (three) times daily.   21 capsule   0   . guaiFENesin-codeine (ROBITUSSIN AC) 100-10 MG/5ML syrup   Oral   Take 5 mLs by mouth 3 (three) times daily as needed for cough.   120 mL   0   . OVER THE COUNTER MEDICATION   Oral   Take 1 tablet by mouth daily as needed (allergies).         . predniSONE (DELTASONE) 20 MG tablet   Oral   Take 3 tablets (60 mg total) by mouth daily.   15  tablet   0    There were no vitals taken for this visit. Physical Exam  Nursing note and vitals reviewed. Constitutional: He is oriented to person, place, and time. He appears well-developed and well-nourished.  HENT:  Head: Normocephalic.  Mouth/Throat: Uvula is midline, oropharynx is clear and moist and mucous membranes are normal.  Eyes: EOM are normal.  Neck: Neck supple.  Cardiovascular: Normal rate.   Pulmonary/Chest: Effort normal. He has decreased breath sounds. He has wheezes.  Patient with decreased air movement. Using accessory muscles with inspiration. Tight with some wheezing heard through out.   Abdominal: Soft. There is no tenderness.  Musculoskeletal: Normal range of motion.  Neurological: He is alert and oriented to person,  place, and time. No cranial nerve deficit.  Skin: Skin is warm and dry.  Psychiatric: He has a normal mood and affect. His behavior is normal.    ED Course  Procedures  Will give albuterol/atrovent neb, prednisone 60 mg PO, chest x-ray and then reassess.  Re examined after neb treatment, much improved, continues to have some wheezing will repeat albuterol and give patient inhaler to go home with him.   MDM  22 y.o. male with acute asthma attack. Will treat with prednisone, albuterol inhaler and he is to follow up with PCP or return here as needed. Discussed with the patient clinical findings and plan of care and all questioned fully answered. He will return if any problems arise.    Medication List    TAKE these medications       predniSONE 10 MG tablet  Commonly known as:  DELTASONE  Take 2 tablets (20 mg total) by mouth 2 (two) times daily.      ASK your doctor about these medications       albuterol 108 (90 BASE) MCG/ACT inhaler  Commonly known as:  PROVENTIL HFA;VENTOLIN HFA  Inhale 2 puffs into the lungs every 6 (six) hours as needed for wheezing.     amoxicillin 500 MG capsule  Commonly known as:  AMOXIL  Take 1 capsule (500 mg total) by mouth 3 (three) times daily.     guaiFENesin-codeine 100-10 MG/5ML syrup  Commonly known as:  ROBITUSSIN AC  Take 5 mLs by mouth 3 (three) times daily as needed for cough.     OVER THE COUNTER MEDICATION  Take 1 tablet by mouth daily as needed (allergies).         [place], [place] 01/15/13 [phone]

## 2013-01-15 NOTE — ED Notes (Signed)
Patient complaining of shortness of breath and wheezing starting about 45 minutes ago. States "I have asthma and I am out of my inhaler."

## 2013-01-18 ENCOUNTER — Encounter (HOSPITAL_COMMUNITY): Payer: Self-pay

## 2013-01-18 ENCOUNTER — Emergency Department (HOSPITAL_COMMUNITY)
Admission: EM | Admit: 2013-01-18 | Discharge: 2013-01-18 | Disposition: A | Discharge status: Home / Self Care | Payer: Managed Care, Other (non HMO) | Attending: Emergency Medicine | Admitting: Emergency Medicine

## 2013-01-18 DIAGNOSIS — F3289 Other specified depressive episodes: Secondary | ICD-10-CM | POA: Insufficient documentation

## 2013-01-18 DIAGNOSIS — F17200 Nicotine dependence, unspecified, uncomplicated: Secondary | ICD-10-CM | POA: Insufficient documentation

## 2013-01-18 DIAGNOSIS — R45851 Suicidal ideations: Secondary | ICD-10-CM | POA: Insufficient documentation

## 2013-01-18 DIAGNOSIS — Z8659 Personal history of other mental and behavioral disorders: Secondary | ICD-10-CM | POA: Insufficient documentation

## 2013-01-18 DIAGNOSIS — Z79899 Other long term (current) drug therapy: Secondary | ICD-10-CM | POA: Insufficient documentation

## 2013-01-18 DIAGNOSIS — F329 Major depressive disorder, single episode, unspecified: Secondary | ICD-10-CM | POA: Insufficient documentation

## 2013-01-18 DIAGNOSIS — G8929 Other chronic pain: Secondary | ICD-10-CM | POA: Insufficient documentation

## 2013-01-18 DIAGNOSIS — R4182 Altered mental status, unspecified: Secondary | ICD-10-CM | POA: Insufficient documentation

## 2013-01-18 DIAGNOSIS — J45909 Unspecified asthma, uncomplicated: Secondary | ICD-10-CM | POA: Insufficient documentation

## 2013-01-18 HISTORY — DX: Bipolar disorder, unspecified: F31.9

## 2013-01-18 HISTORY — DX: Other chronic pain: G89.29

## 2013-01-18 HISTORY — DX: Personality disorder, unspecified: F60.9

## 2013-01-18 HISTORY — DX: Dorsalgia, unspecified: M54.9

## 2013-01-18 LAB — BASIC METABOLIC PANEL
CO2: 26 mEq/L (ref 19–32)
Chloride: 104 mEq/L (ref 96–112)
GFR calc Af Amer: >90 mL/min (ref >90)
Sodium: 139 mEq/L (ref 135–145)

## 2013-01-18 LAB — RAPID URINE DRUG SCREEN, HOSP PERFORMED
Amphetamines: NOT DETECTED
Benzodiazepines: NOT DETECTED
Opiates: NOT DETECTED

## 2013-01-18 LAB — CBC WITH DIFFERENTIAL/PLATELET
Basophils Absolute: 0 10*3/uL (ref 0.0–0.1)
Lymphocytes Relative: 16 % (ref 12–46)
Neutro Abs: 8.8 10*3/uL — ABNORMAL HIGH (ref 1.7–7.7)
Neutrophils Relative %: 69 % (ref 43–77)
Platelets: 184 10*3/uL (ref 150–400)
RDW: 13.3 % (ref 11.5–15.5)
WBC: 12.7 10*3/uL — ABNORMAL HIGH (ref 4.0–10.5)

## 2013-01-18 LAB — ETHANOL: Alcohol, Ethyl (B): <11 mg/dL (ref 0–11)

## 2013-01-18 MED ORDER — CARBAMAZEPINE 200 MG PO TABS: 200.0000 mg | ORAL_TABLET | Freq: Two times a day (BID) | ORAL | Status: DC

## 2013-01-18 NOTE — ED Notes (Signed)
Pt presents to er with request for help with depression. States that he he been treated for depression in the past after attempting to shoot himself. Depression became worse this am after he was kicked out of his girlfriend's house by her parents whom they were living with. Pt states that he had thought about trying to take a bunch of pills after that happened but denies any SI/HI at present. Sitter remains at bedside,

## 2013-01-18 NOTE — ED Notes (Signed)
Pt still waiting for telepsych, visitor and sitter remains at bedside,

## 2013-01-18 NOTE — ED Notes (Signed)
Pt arrived in custody of of [place] [employer], pt called 911 stating that he was going to kill himself w/ a knife.  When pd arrived, no knife was present. He lives w/ his girlfriend and her parents, they told him he has to get a job or leave.  Dx w/ depression in 2011, personality disorder, bipolar, not taking any meds.  Recently lost his daughter and grandmother, 2013/2014

## 2013-01-18 NOTE — ED Provider Notes (Addendum)
History    This chart was scribed for Robert Lennert, MD by Leone Payor, ED Scribe. This patient was seen in room [facility]03/[facility]03 and the patient's care was started 7:54 AM.  CSN: [id number] Arrival date & time 01/18/13  0736  None    Chief Complaint  Patient presents with  . V70.1    Patient is a 22 y.o. male presenting with altered mental status. The history is provided by the patient. No language interpreter was used.  Altered Mental Status Presenting symptoms: behavior changes   Severity:  Mild Most recent episode:  Today Episode history:  Multiple Associated symptoms: suicidal behavior   Associated symptoms: no abdominal pain, no hallucinations, no headaches, no rash and no seizures     HPI Comments: CHANCEY Benton is a 22 y.o. male who presents to the Emergency Department requesting medical clearance. Per [employer] police dept, pt called 911 stating he was going to kill himself with a knife. When pd arrived, pt no longer had a knife and reports putting it back inside home. He currently lives with girlfriend and her parents but they told him he has to get a job or leave. He was diagnosed with depression in 2011, personality disorder, bipolar. He is not currently taking any medications regularly. States he doesn't have any SI currently but has tried to hurt himself in the past. He recently lost his daughter and grandmother in the past year.   Past Medical History  Diagnosis Date  . Asthma   . Depression   . Headache(784.0)     Migranes  . Bipolar 1 disorder   . Personality disorder   . Chronic back pain    History reviewed. No pertinent past surgical history. Family History  Problem Relation Age of Onset  . Stroke Paternal Grandfather     grandparents  . Heart disease Other     grandparents  . Hypertension Other     grandparents and other relative  . Kidney disease Other     grandparents  . Diabetes Other     grandparents  . Asthma Other     grandparents  . Cancer  Neg Hx    History  Substance Use Topics  . Smoking status: Current Every Day Smoker    Types: Cigarettes  . Smokeless tobacco: Not on file  . Alcohol Use: No    Review of Systems  Constitutional: Negative for appetite change and fatigue.  HENT: Negative for congestion, sinus pressure and ear discharge.   Eyes: Negative for discharge.  Respiratory: Negative for cough.   Cardiovascular: Negative for chest pain.  Gastrointestinal: Negative for abdominal pain and diarrhea.  Genitourinary: Negative for frequency and hematuria.  Musculoskeletal: Negative for back pain.  Skin: Negative for rash.  Neurological: Negative for seizures and headaches.  Psychiatric/Behavioral: Positive for altered mental status. Negative for suicidal ideas and hallucinations.    Allergies  Review of patient's allergies indicates no known allergies.  Home Medications   Current Outpatient Rx  Name  Route  Sig  Dispense  Refill  . albuterol (PROVENTIL HFA;VENTOLIN HFA) 108 (90 BASE) MCG/ACT inhaler   Inhalation   Inhale 2 puffs into the lungs every 6 (six) hours as needed for wheezing.         Marland Kitchen amoxicillin (AMOXIL) 500 MG capsule   Oral   Take 1 capsule (500 mg total) by mouth 3 (three) times daily.   21 capsule   0   . guaiFENesin-codeine (ROBITUSSIN AC) 100-10 MG/5ML syrup  Oral   Take 5 mLs by mouth 3 (three) times daily as needed for cough.   120 mL   0   . OVER THE COUNTER MEDICATION   Oral   Take 1 tablet by mouth daily as needed (allergies).         . predniSONE (DELTASONE) 10 MG tablet   Oral   Take 2 tablets (20 mg total) by mouth 2 (two) times daily.   20 tablet   0    There were no vitals taken for this visit. Physical Exam  Nursing note and vitals reviewed. Constitutional: He is oriented to person, place, and time. He appears well-developed.  HENT:  Head: Normocephalic.  Eyes: Conjunctivae and EOM are normal. No scleral icterus.  Neck: Neck supple. No thyromegaly  present.  Cardiovascular: Normal rate and regular rhythm.  Exam reveals no gallop and no friction rub.   No murmur heard. Pulmonary/Chest: No stridor. He has no wheezes. He has no rales. He exhibits no tenderness.  Abdominal: He exhibits no distension. There is no tenderness. There is no rebound.  Musculoskeletal: Normal range of motion. He exhibits no edema.  Lymphadenopathy:    He has no cervical adenopathy.  Neurological: He is oriented to person, place, and time. Coordination normal.  Skin: No rash noted. No erythema.  Psychiatric: His behavior is normal. He exhibits a depressed mood. He expresses no suicidal ideation.    ED Course  Procedures (including critical care time)  DIAGNOSTIC STUDIES: Oxygen Saturation is 99% on RA, normal by my interpretation.    COORDINATION OF CARE: 8:21 AM Discussed treatment plan with pt at bedside and pt agreed to plan.   Labs Reviewed  CBC WITH DIFFERENTIAL - Abnormal; Notable for the following:    WBC 12.7 (*)    Neutro Abs 8.8 (*)    Monocytes Absolute 1.1 (*)    Eosinophils Relative 6 (*)    All other components within normal limits  BASIC METABOLIC PANEL - Abnormal; Notable for the following:    Glucose, Bld 108 (*)    All other components within normal limits  URINE RAPID DRUG SCREEN (HOSP PERFORMED) - Abnormal; Notable for the following:    Tetrahydrocannabinol POSITIVE (*)    All other components within normal limits  ETHANOL   Tele psyc suggested out pt tx and tegretol MDM   The chart was scribed for me under my direct supervision.  I personally performed the history, physical, and medical decision making and all procedures in the evaluation of this patient.Robert Lennert, MD 01/18/13 1043  Robert Lennert, MD 01/18/13 [phone]

## 2013-08-02 ENCOUNTER — Emergency Department (HOSPITAL_COMMUNITY)
Admission: EM | Admit: 2013-08-02 | Discharge: 2013-08-02 | Disposition: A | Discharge status: Home / Self Care | Payer: Managed Care, Other (non HMO) | Attending: Emergency Medicine | Admitting: Emergency Medicine

## 2013-08-02 ENCOUNTER — Encounter (HOSPITAL_COMMUNITY): Payer: Self-pay | Admitting: Emergency Medicine

## 2013-08-02 DIAGNOSIS — R55 Syncope and collapse: Secondary | ICD-10-CM

## 2013-08-02 DIAGNOSIS — F319 Bipolar disorder, unspecified: Secondary | ICD-10-CM | POA: Insufficient documentation

## 2013-08-02 DIAGNOSIS — F17200 Nicotine dependence, unspecified, uncomplicated: Secondary | ICD-10-CM | POA: Insufficient documentation

## 2013-08-02 DIAGNOSIS — G8929 Other chronic pain: Secondary | ICD-10-CM | POA: Insufficient documentation

## 2013-08-02 DIAGNOSIS — Z79899 Other long term (current) drug therapy: Secondary | ICD-10-CM | POA: Insufficient documentation

## 2013-08-02 DIAGNOSIS — J45909 Unspecified asthma, uncomplicated: Secondary | ICD-10-CM | POA: Insufficient documentation

## 2013-08-02 DIAGNOSIS — Z8679 Personal history of other diseases of the circulatory system: Secondary | ICD-10-CM | POA: Insufficient documentation

## 2013-08-02 NOTE — ED Provider Notes (Signed)
CSN: [id number]     Arrival date & time 08/02/13  4098 History  This chart was scribed for Joya Gaskins, MD by Lindajo Royal, ED Scribe. This patient was seen in room APA18/[facility]18 and the patient's care was started at 10:12 AM    Chief Complaint  Patient presents with  . Syncope    Patient is a 23 y.o. male presenting with syncope. The history is provided by the patient. No language interpreter was used.  Loss of Consciousness Episode history: one today and one last june/ july  Most recent episode:  Yesterday Chronicity:  Recurrent Context: standing up   Witnessed: no   Associated symptoms: no chest pain, no dizziness, no fever, no headaches, no nausea, no seizures, no shortness of breath and no vomiting   Associated symptoms comment:  Mild cough Risk factors: no congenital heart disease and no seizures     HPI Comments: Robert Benton is a 23 y.o. male who presents to the Emergency Department complaining of a syncopal episode that occured yesterday. He states that he collapsed when he stood up after lying on the bed. Pt denies feeling dizzy prior to the episode. He denies seizure and tongue biting during the episode. He states he has had a mild cough recently but he denies any other recent illness including fever, headache, chills, vomiting, diarrhea, and dehydration. Pt admits to drinking alcohol the night before but denies over consumption. Pt reports prior syncopal episode occuring last June or July. Pt reports having asthma. Pt denies having any family h/o early, sudden deaths due to unknown causes. Pt denies taking any medications currently (used to be on tegretol) He reports he is active and has never had CP/syncope with exertion or physical activity   Past Medical History  Diagnosis Date  . Asthma   . Depression   . Headache(784.0)     Migranes  . Bipolar 1 disorder   . Personality disorder   . Chronic back pain    History reviewed. No pertinent past surgical  history. Family History  Problem Relation Age of Onset  . Stroke Paternal Grandfather     grandparents  . Heart disease Other     grandparents  . Hypertension Other     grandparents and other relative  . Kidney disease Other     grandparents  . Diabetes Other     grandparents  . Asthma Other     grandparents  . Cancer Neg Hx    History  Substance Use Topics  . Smoking status: Current Every Day Smoker    Types: Cigarettes  . Smokeless tobacco: Not on file  . Alcohol Use: No    Review of Systems  Constitutional: Negative for fever.  Respiratory: Negative for shortness of breath.   Cardiovascular: Positive for syncope. Negative for chest pain.  Gastrointestinal: Negative for nausea and vomiting.  Neurological: Negative for dizziness, seizures and headaches.  All other systems reviewed and are negative.    Allergies  Review of patient's allergies indicates no known allergies.  Home Medications   Current Outpatient Rx  Name  Route  Sig  Dispense  Refill  . albuterol (PROVENTIL HFA;VENTOLIN HFA) 108 (90 BASE) MCG/ACT inhaler   Inhalation   Inhale 2 puffs into the lungs every 6 (six) hours as needed for wheezing.         . carbamazepine (TEGRETOL) 200 MG tablet   Oral   Take 1 tablet (200 mg total) by mouth 2 (two) times daily.  60 tablet   0    BP 125/76  Pulse 71  Temp(Src) 98.6 F (37 C) (Oral)  Resp 18  SpO2 98%  Physical Exam CONSTITUTIONAL: Well developed/well nourished HEAD: Normocephalic/atraumatic EYES: EOMI/PERRL ENMT: Mucous membranes moist, no tongue lacerations NECK: supple no meningeal signs SPINE:entire spine nontender CV: S1/S2 noted, no murmurs/rubs/gallops noted LUNGS: Lungs are clear to auscultation bilaterally, no apparent distress ABDOMEN: soft, nontender, no rebound or guarding GU:no cva tenderness NEURO: Pt is awake/alert, moves all extremitiesx4 EXTREMITIES: pulses normal, full ROM, no edema, no calf tenderness SKIN: warm,  color normal PSYCH: no abnormalities of mood noted  ED Course  Procedures (including critical care time)  DIAGNOSTIC STUDIES: Oxygen Saturation is 98% on RA, normal, by my interpretation.    COORDINATION OF CARE: 10:21 AM- Pt advised of plan for treatment and pt agrees.   Pt well appearing, stable for d/c Advised cardiology f/u (?ectopic atrial rhythm, but I don't identify delta wave, no signs of Brugada/prolonged qt) Discussed driving restrictions with h/o syncope. We discussed strict return precautions  Labs Review Labs Reviewed - No data to display  Imaging Review No results found.  EKG Interpretation    Date/Time:  Sunday August 02 2013 10:26:10 EST Ventricular Rate:  61 PR Interval:  110 QRS Duration: 84 QT Interval:  408 QTC Calculation: 410 R Axis:   71 Text Interpretation:  Normal sinus rhythm Abnormal ECG Confirmed by Bebe ShaggyWICKLINE  MD, Mikaelyn Arthurs ([id number]) on 08/02/2013 10:39:40 AM            MDM  No diagnosis found. Nursing notes including past medical history and social history reviewed and considered in documentation   I personally performed the services described in this documentation, which was scribed in my presence. The recorded information has been reviewed and is accurate.        Joya Gaskinsonald W Yasira Engelson, MD 08/02/13 [phone]

## 2013-08-02 NOTE — ED Notes (Signed)
Discharge instructions reviewed with pt, questions answered. Pt verbalized understanding.  

## 2013-08-02 NOTE — ED Notes (Signed)
Pt reports near syncopal episode yesterday afternoon. Pt states "I stood up and started feeling lightheaded and then I went down". Pt denies hitting head and is able to recall entire event. Pt states since episode he's had intermittent lightheadedness. Pt denies any pain.

## 2013-08-02 NOTE — Discharge Instructions (Signed)
Driving and Equipment Restrictions °Some medical problems make it dangerous to drive, ride a bike, or use machines. Some of these problems are: °· A hard blow to the head (concussion). °· Passing out (fainting). °· Twitching and shaking (seizures). °· Low blood sugar. °· Taking medicine to help you relax (sedatives). °· Taking pain medicines. °· Wearing an eye patch. °· Wearing splints. This can make it hard to use parts of your body that you need to drive safely. °HOME CARE  °· Do not drive until your doctor says it is okay. °· Do not use machines until your doctor says it is okay. °You may need a form signed by your doctor (medical release) before you can drive again. You may also need this form before you do other tasks where you need to be fully alert. °MAKE SURE YOU: °· Understand these instructions. °· Will watch your condition. °· Will get help right away if you are not doing well or get worse. °Document Released: 08/16/2004 Document Revised: 10/01/2011 Document Reviewed: 11/16/2009 °ExitCare® Patient Information ©2014 [employer]. ° °

## 2013-10-05 ENCOUNTER — Emergency Department (HOSPITAL_COMMUNITY)
Admission: EM | Admit: 2013-10-05 | Discharge: 2013-10-05 | Disposition: A | Discharge status: Home / Self Care | Payer: Managed Care, Other (non HMO) | Attending: Emergency Medicine | Admitting: Emergency Medicine

## 2013-10-05 ENCOUNTER — Telehealth: Payer: Self-pay | Admitting: General Practice

## 2013-10-05 ENCOUNTER — Encounter (HOSPITAL_COMMUNITY): Payer: Self-pay | Admitting: Emergency Medicine

## 2013-10-05 DIAGNOSIS — J45909 Unspecified asthma, uncomplicated: Secondary | ICD-10-CM | POA: Insufficient documentation

## 2013-10-05 DIAGNOSIS — Z79899 Other long term (current) drug therapy: Secondary | ICD-10-CM | POA: Insufficient documentation

## 2013-10-05 DIAGNOSIS — F17200 Nicotine dependence, unspecified, uncomplicated: Secondary | ICD-10-CM | POA: Insufficient documentation

## 2013-10-05 DIAGNOSIS — F319 Bipolar disorder, unspecified: Secondary | ICD-10-CM | POA: Insufficient documentation

## 2013-10-05 DIAGNOSIS — Z760 Encounter for issue of repeat prescription: Secondary | ICD-10-CM

## 2013-10-05 DIAGNOSIS — J45901 Unspecified asthma with (acute) exacerbation: Secondary | ICD-10-CM

## 2013-10-05 DIAGNOSIS — G8929 Other chronic pain: Secondary | ICD-10-CM | POA: Insufficient documentation

## 2013-10-05 DIAGNOSIS — Z8679 Personal history of other diseases of the circulatory system: Secondary | ICD-10-CM | POA: Insufficient documentation

## 2013-10-05 MED ORDER — ALBUTEROL SULFATE HFA 108 (90 BASE) MCG/ACT IN AERS
2.0000 | INHALATION_SPRAY | RESPIRATORY_TRACT | Status: DC | PRN
  Administered 2013-10-05: 2 via RESPIRATORY_TRACT
  Filled 2013-10-05: qty 6.7

## 2013-10-05 MED ORDER — ALBUTEROL SULFATE (2.5 MG/3ML) 0.083% IN NEBU
5.0000 mg | INHALATION_SOLUTION | Freq: Once | RESPIRATORY_TRACT | Status: AC
  Administered 2013-10-05: 5 mg via RESPIRATORY_TRACT
  Filled 2013-10-05: qty 6

## 2013-10-05 NOTE — Telephone Encounter (Signed)
appt with mae at 2:00 on wed 25

## 2013-10-05 NOTE — ED Provider Notes (Signed)
CSN: [id number]     Arrival date & time 10/05/13  0024 History   First MD Initiated Contact with Patient 10/05/13 0259     Chief Complaint  Patient presents with  . Asthma     (Consider location/radiation/quality/duration/timing/severity/associated sxs/prior Treatment) HPI This is a 23 year old male with a history of asthma. He is out of his inhaler. He experienced the sudden onset of a moderate to severe asthma attack about 20 minutes prior to arrival. He was given an albuterol neb treatment on arrival with significant relief of his symptoms. He had some occasional coughing with his asthma but denies fever, chills, cold symptoms, vomiting or diarrhea.  Past Medical History  Diagnosis Date  . Asthma   . Depression   . Headache(784.0)     Migranes  . Bipolar 1 disorder   . Personality disorder   . Chronic back pain    History reviewed. No pertinent past surgical history. Family History  Problem Relation Age of Onset  . Stroke Paternal Grandfather     grandparents  . Heart disease Other     grandparents  . Hypertension Other     grandparents and other relative  . Kidney disease Other     grandparents  . Diabetes Other     grandparents  . Asthma Other     grandparents  . Cancer Neg Hx    History  Substance Use Topics  . Smoking status: Current Every Day Smoker    Types: Cigarettes  . Smokeless tobacco: Not on file  . Alcohol Use: No    Review of Systems  All other systems reviewed and are negative.      Allergies  Review of patient's allergies indicates no known allergies.  Home Medications   Current Outpatient Rx  Name  Route  Sig  Dispense  Refill  . albuterol (PROVENTIL HFA;VENTOLIN HFA) 108 (90 BASE) MCG/ACT inhaler   Inhalation   Inhale 2 puffs into the lungs every 6 (six) hours as needed for wheezing.         . carbamazepine (TEGRETOL) 200 MG tablet   Oral   Take 1 tablet (200 mg total) by mouth 2 (two) times daily.   60 tablet   0    BP  131/84  Pulse 59  Temp(Src) 97.6 F (36.4 C) (Oral)  Resp 16  Ht 6' (1.829 m)  Wt 140 lb (63.504 kg)  BMI 18.98 kg/m2  SpO2 98%  Physical Exam General: Well-developed, well-nourished male in no acute distress; appearance consistent with age of record HENT: normocephalic; atraumatic Eyes: pupils equal, round and reactive to light; extraocular muscles intact Neck: supple Heart: regular rate and rhythm Lungs: clear to auscultation bilaterally Abdomen: soft; nondistended; nontenderbowel sounds present Extremities: No deformity; full range of motion Neurologic: Awake, alert and oriented; motor function intact in all extremities and symmetric; no facial droop Skin: Warm and dry Psychiatric: Normal mood and affect    ED Course  Procedures (including critical care time)   MDM      Hanley SeamenJohn L Zayvian Mcmurtry, MD 10/05/13 [phone]

## 2013-10-05 NOTE — ED Notes (Signed)
Patient c/o asthma attack that started about 20 minutes ago.

## 2013-10-05 NOTE — Discharge Instructions (Signed)
Asthma, Adult Asthma is a recurring condition in which the airways tighten and narrow. Asthma can make it difficult to breathe. It can cause coughing, wheezing, and shortness of breath. Asthma episodes (also called asthma attacks) range from minor to life-threatening. Asthma cannot be cured, but medicines and lifestyle changes can help control it. CAUSES Asthma is believed to be caused by inherited (genetic) and environmental factors, but its exact cause is unknown. Asthma may be triggered by allergens, lung infections, or irritants in the air. Asthma triggers are different for each person. Common triggers include:   Animal dander.  Dust mites.  Cockroaches.  Pollen from trees or grass.  Mold.  Smoke.  Air pollutants such as dust, household cleaners, hair sprays, aerosol sprays, paint fumes, strong chemicals, or strong odors.  Cold air, weather changes, and winds (which increase molds and pollens in the air).  Strong emotional expressions such as crying or laughing hard.  Stress.  Certain medicines (such as aspirin) or types of drugs (such as beta-blockers).  Sulfites in foods and drinks. Foods and drinks that may contain sulfites include dried fruit, potato chips, and sparkling grape juice.  Infections or inflammatory conditions such as the flu, a cold, or an inflammation of the nasal membranes (rhinitis).  Gastroesophageal reflux disease (GERD).  Exercise or strenuous activity. SYMPTOMS Symptoms may occur immediately after asthma is triggered or many hours later. Symptoms include:  Wheezing.  Excessive nighttime or early morning coughing.  Frequent or severe coughing with a common cold.  Chest tightness.  Shortness of breath. DIAGNOSIS  The diagnosis of asthma is made by a review of your medical history and a physical exam. Tests may also be performed. These may include:  Lung function studies. These tests show how much air you breath in and out.  Allergy  tests.  Imaging tests such as X-rays. TREATMENT  Asthma cannot be cured, but it can usually be controlled. Treatment involves identifying and avoiding your asthma triggers. It also involves medicines. There are 2 classes of medicine used for asthma treatment:   Controller medicines. These prevent asthma symptoms from occurring. They are usually taken every day.  Reliever or rescue medicines. These quickly relieve asthma symptoms. They are used as needed and provide short-term relief. Your health care provider will help you create an asthma action plan. An asthma action plan is a written plan for managing and treating your asthma attacks. It includes a list of your asthma triggers and how they may be avoided. It also includes information on when medicines should be taken and when their dosage should be changed. An action plan may also involve the use of a device called a peak flow meter. A peak flow meter measures how well the lungs are working. It helps you monitor your condition. HOME CARE INSTRUCTIONS   Take medicine as directed by your health care provider. Speak with your health care provider if you have questions about how or when to take the medicines.  Use a peak flow meter as directed by your health care provider. Record and keep track of readings.  Understand and use the action plan to help minimize or stop an asthma attack without needing to seek medical care.  Control your home environment in the following ways to help prevent asthma attacks:  Do not smoke. Avoid being exposed to secondhand smoke.  Change your heating and air conditioning filter regularly.  Limit your use of fireplaces and wood stoves.  Get rid of pests (such as roaches and   mice) and their droppings.  Throw away plants if you see mold on them.  Clean your floors and dust regularly. Use unscented cleaning products.  Try to have someone else vacuum for you regularly. Stay out of rooms while they are being  vacuumed and for a short while afterward. If you vacuum, use a dust mask from a hardware store, a double-layered or microfilter vacuum cleaner bag, or a vacuum cleaner with a HEPA filter.  Replace carpet with wood, tile, or vinyl flooring. Carpet can trap dander and dust.  Use allergy-proof pillows, mattress covers, and box spring covers.  Wash bed sheets and blankets every week in hot water and dry them in a dryer.  Use blankets that are made of polyester or cotton.  Clean bathrooms and kitchens with bleach. If possible, have someone repaint the walls in these rooms with mold-resistant paint. Keep out of the rooms that are being cleaned and painted.  Wash hands frequently. SEEK MEDICAL CARE IF:   You have wheezing, shortness of breath, or a cough even if taking medicine to prevent attacks.  The colored mucus you cough up (sputum) is thicker than usual.  Your sputum changes from clear or white to yellow, green, gray, or bloody.  You have any problems that may be related to the medicines you are taking (such as a rash, itching, swelling, or trouble breathing).  You are using a reliever medicine more than 2 3 times per week.  Your peak flow is still at 50 79% of you personal best after following your action plan for 1 hour. SEEK IMMEDIATE MEDICAL CARE IF:   You seem to be getting worse and are unresponsive to treatment during an asthma attack.  You are short of breath even at rest.  You get short of breath when doing very little physical activity.  You have difficulty eating, drinking, or talking due to asthma symptoms.  You develop chest pain.  You develop a fast heartbeat.  You have a bluish color to your lips or fingernails.  You are lightheaded, dizzy, or faint.  Your peak flow is less than 50% of your personal best.  You have a fever or persistent symptoms for more than 2 3 days.  You have a fever and symptoms suddenly get worse. MAKE SURE YOU:   Understand these  instructions.  Will watch your condition.  Will get help right away if you are not doing well or get worse. Document Released: 07/09/2005 Document Revised: 03/11/2013 Document Reviewed: 02/05/2013 Exit[employer] Patient Information 2014 [employer].  

## 2013-10-14 ENCOUNTER — Encounter: Payer: Self-pay | Admitting: General Practice

## 2013-10-14 ENCOUNTER — Ambulatory Visit (INDEPENDENT_AMBULATORY_CARE_PROVIDER_SITE_OTHER): Payer: Managed Care, Other (non HMO) | Admitting: General Practice

## 2013-10-14 VITALS — BP 123/74 | HR 72 | Temp 98.3°F | Ht 69.75 in | Wt 137.2 lb

## 2013-10-14 DIAGNOSIS — Z716 Tobacco abuse counseling: Secondary | ICD-10-CM

## 2013-10-14 DIAGNOSIS — J45909 Unspecified asthma, uncomplicated: Secondary | ICD-10-CM

## 2013-10-14 DIAGNOSIS — Z7189 Other specified counseling: Secondary | ICD-10-CM

## 2013-10-14 MED ORDER — ALBUTEROL SULFATE HFA 108 (90 BASE) MCG/ACT IN AERS
2.0000 | INHALATION_SPRAY | Freq: Four times a day (QID) | RESPIRATORY_TRACT | Status: DC | PRN
Start: 2013-10-14 — End: 2013-11-09

## 2013-10-14 NOTE — Patient Instructions (Signed)
Asthma, Adult Asthma is a recurring condition in which the airways tighten and narrow. Asthma can make it difficult to breathe. It can cause coughing, wheezing, and shortness of breath. Asthma episodes (also called asthma attacks) range from minor to life-threatening. Asthma cannot be cured, but medicines and lifestyle changes can help control it. CAUSES Asthma is believed to be caused by inherited (genetic) and environmental factors, but its exact cause is unknown. Asthma may be triggered by allergens, lung infections, or irritants in the air. Asthma triggers are different for each person. Common triggers include:   Animal dander.  Dust mites.  Cockroaches.  Pollen from trees or grass.  Mold.  Smoke.  Air pollutants such as dust, household cleaners, hair sprays, aerosol sprays, paint fumes, strong chemicals, or strong odors.  Cold air, weather changes, and winds (which increase molds and pollens in the air).  Strong emotional expressions such as crying or laughing hard.  Stress.  Certain medicines (such as aspirin) or types of drugs (such as beta-blockers).  Sulfites in foods and drinks. Foods and drinks that may contain sulfites include dried fruit, potato chips, and sparkling grape juice.  Infections or inflammatory conditions such as the flu, a cold, or an inflammation of the nasal membranes (rhinitis).  Gastroesophageal reflux disease (GERD).  Exercise or strenuous activity. SYMPTOMS Symptoms may occur immediately after asthma is triggered or many hours later. Symptoms include:  Wheezing.  Excessive nighttime or early morning coughing.  Frequent or severe coughing with a common cold.  Chest tightness.  Shortness of breath. DIAGNOSIS  The diagnosis of asthma is made by a review of your medical history and a physical exam. Tests may also be performed. These may include:  Lung function studies. These tests show how much air you breath in and out.  Allergy  tests.  Imaging tests such as X-rays. TREATMENT  Asthma cannot be cured, but it can usually be controlled. Treatment involves identifying and avoiding your asthma triggers. It also involves medicines. There are 2 classes of medicine used for asthma treatment:   Controller medicines. These prevent asthma symptoms from occurring. They are usually taken every day.  Reliever or rescue medicines. These quickly relieve asthma symptoms. They are used as needed and provide short-term relief. Your health care provider will help you create an asthma action plan. An asthma action plan is a written plan for managing and treating your asthma attacks. It includes a list of your asthma triggers and how they may be avoided. It also includes information on when medicines should be taken and when their dosage should be changed. An action plan may also involve the use of a device called a peak flow meter. A peak flow meter measures how well the lungs are working. It helps you monitor your condition. HOME CARE INSTRUCTIONS   Take medicine as directed by your health care provider. Speak with your health care provider if you have questions about how or when to take the medicines.  Use a peak flow meter as directed by your health care provider. Record and keep track of readings.  Understand and use the action plan to help minimize or stop an asthma attack without needing to seek medical care.  Control your home environment in the following ways to help prevent asthma attacks:  Do not smoke. Avoid being exposed to secondhand smoke.  Change your heating and air conditioning filter regularly.  Limit your use of fireplaces and wood stoves.  Get rid of pests (such as roaches and   mice) and their droppings.  Throw away plants if you see mold on them.  Clean your floors and dust regularly. Use unscented cleaning products.  Try to have someone else vacuum for you regularly. Stay out of rooms while they are being  vacuumed and for a short while afterward. If you vacuum, use a dust mask from a hardware store, a double-layered or microfilter vacuum cleaner bag, or a vacuum cleaner with a HEPA filter.  Replace carpet with wood, tile, or vinyl flooring. Carpet can trap dander and dust.  Use allergy-proof pillows, mattress covers, and box spring covers.  Wash bed sheets and blankets every week in hot water and dry them in a dryer.  Use blankets that are made of polyester or cotton.  Clean bathrooms and kitchens with bleach. If possible, have someone repaint the walls in these rooms with mold-resistant paint. Keep out of the rooms that are being cleaned and painted.  Wash hands frequently. SEEK MEDICAL CARE IF:   You have wheezing, shortness of breath, or a cough even if taking medicine to prevent attacks.  The colored mucus you cough up (sputum) is thicker than usual.  Your sputum changes from clear or white to yellow, green, gray, or bloody.  You have any problems that may be related to the medicines you are taking (such as a rash, itching, swelling, or trouble breathing).  You are using a reliever medicine more than 2 3 times per week.  Your peak flow is still at 50 79% of you personal best after following your action plan for 1 hour. SEEK IMMEDIATE MEDICAL CARE IF:   You seem to be getting worse and are unresponsive to treatment during an asthma attack.  You are short of breath even at rest.  You get short of breath when doing very little physical activity.  You have difficulty eating, drinking, or talking due to asthma symptoms.  You develop chest pain.  You develop a fast heartbeat.  You have a bluish color to your lips or fingernails.  You are lightheaded, dizzy, or faint.  Your peak flow is less than 50% of your personal best.  You have a fever or persistent symptoms for more than 2 3 days.  You have a fever and symptoms suddenly get worse. MAKE SURE YOU:   Understand these  instructions.  Will watch your condition.  Will get help right away if you are not doing well or get worse. Document Released: 07/09/2005 Document Revised: 03/11/2013 Document Reviewed: 02/05/2013 Exit[employer] Patient Information 2014 [employer].  

## 2013-10-14 NOTE — Progress Notes (Signed)
   Subjective:    Patient ID: Robert Benton, male    DOB: 04/10/1991, 23 y.o.   MRN: [id number]  HPI Patient presents today for hospital follow up. He was seen on 10/05/13 and treated for asthma attack. He reports using an albuterol inhaler. Denies being compliant with albuterol inhaler, at times doesn't get refills and is without medication. Smokes 1 pack cigarettes per week.     Review of Systems  Constitutional: Negative for fever and chills.  Respiratory: Negative for chest tightness, shortness of breath and wheezing.   Cardiovascular: Negative for chest pain and palpitations.       Objective:   Physical Exam  Constitutional: He is oriented to person, place, and time. He appears well-developed and well-nourished.  Cardiovascular: Normal rate, regular rhythm and normal heart sounds.   Pulmonary/Chest: Effort normal and breath sounds normal. No respiratory distress. He exhibits no tenderness.  Neurological: He is alert and oriented to person, place, and time.  Skin: Skin is warm and dry.  Psychiatric: He has a normal mood and affect.          Assessment & Plan:  1. Asthma - albuterol (PROVENTIL HFA;VENTOLIN HFA) 108 (90 BASE) MCG/ACT inhaler; Inhale 2 puffs into the lungs every 6 (six) hours as needed for wheezing.  Dispense: 1 Inhaler; Refill: 4 -patient information provided and discussed on asthma -discussed smoking cessation -RTO prn -Patient verbalized understanding Coralie KeensMae E. Jeronimo Hellberg, FNP-C

## 2013-11-09 ENCOUNTER — Encounter (HOSPITAL_COMMUNITY): Payer: Self-pay | Admitting: Emergency Medicine

## 2013-11-09 ENCOUNTER — Emergency Department (HOSPITAL_COMMUNITY)
Admission: EM | Admit: 2013-11-09 | Discharge: 2013-11-09 | Disposition: A | Discharge status: Home / Self Care | Payer: Managed Care, Other (non HMO) | Attending: Emergency Medicine | Admitting: Emergency Medicine

## 2013-11-09 DIAGNOSIS — J441 Chronic obstructive pulmonary disease with (acute) exacerbation: Secondary | ICD-10-CM | POA: Insufficient documentation

## 2013-11-09 DIAGNOSIS — J45901 Unspecified asthma with (acute) exacerbation: Secondary | ICD-10-CM

## 2013-11-09 DIAGNOSIS — J302 Other seasonal allergic rhinitis: Secondary | ICD-10-CM

## 2013-11-09 DIAGNOSIS — F17200 Nicotine dependence, unspecified, uncomplicated: Secondary | ICD-10-CM | POA: Insufficient documentation

## 2013-11-09 DIAGNOSIS — F319 Bipolar disorder, unspecified: Secondary | ICD-10-CM | POA: Insufficient documentation

## 2013-11-09 DIAGNOSIS — J309 Allergic rhinitis, unspecified: Secondary | ICD-10-CM | POA: Insufficient documentation

## 2013-11-09 DIAGNOSIS — Z79899 Other long term (current) drug therapy: Secondary | ICD-10-CM | POA: Insufficient documentation

## 2013-11-09 DIAGNOSIS — G8929 Other chronic pain: Secondary | ICD-10-CM | POA: Insufficient documentation

## 2013-11-09 MED ORDER — IPRATROPIUM-ALBUTEROL 0.5-2.5 (3) MG/3ML IN SOLN
3.0000 mL | Freq: Once | RESPIRATORY_TRACT | Status: AC
  Administered 2013-11-09: 3 mL via RESPIRATORY_TRACT
  Filled 2013-11-09: qty 3

## 2013-11-09 MED ORDER — ALBUTEROL SULFATE HFA 108 (90 BASE) MCG/ACT IN AERS: 1.0000 | INHALATION_SPRAY | Freq: Four times a day (QID) | RESPIRATORY_TRACT | Status: DC | PRN

## 2013-11-09 MED ORDER — CETIRIZINE-PSEUDOEPHEDRINE ER 5-120 MG PO TB12: 1.0000 | ORAL_TABLET | Freq: Two times a day (BID) | ORAL | Status: DC

## 2013-11-09 MED ORDER — PREDNISONE 10 MG PO TABS: ORAL_TABLET | ORAL | Status: DC

## 2013-11-09 MED ORDER — ALBUTEROL SULFATE (2.5 MG/3ML) 0.083% IN NEBU
5.0000 mg | INHALATION_SOLUTION | Freq: Once | RESPIRATORY_TRACT | Status: AC
  Administered 2013-11-09: 5 mg via RESPIRATORY_TRACT
  Filled 2013-11-09: qty 6

## 2013-11-09 MED ORDER — PREDNISONE 50 MG PO TABS
60.0000 mg | ORAL_TABLET | Freq: Once | ORAL | Status: AC
  Administered 2013-11-09: 60 mg via ORAL
  Filled 2013-11-09 (×2): qty 1

## 2013-11-09 NOTE — ED Notes (Signed)
Complain of cough for three days. Denies other symptoms

## 2013-11-09 NOTE — ED Notes (Signed)
Pt presenting to ED with c/o cough and head congestion x3days. Pt states small amount of yellow sputum with cough. Pt has hx of asthma and seasonal allergies.

## 2013-11-09 NOTE — ED Provider Notes (Signed)
CSN: [id number]     Arrival date & time 11/09/13  0845 History  .This chart was scribed for non-physician practitioner working with [name] Keven Benton, by Tana ConchStephen Benton ED Scribe. This patient was seen in [facility] and the patient's care was started at 9:10 AM.     Chief Complaint  Patient presents with  . Cough     The history is provided by the patient. No language interpreter was used.    HPI Comments: Robert Benton is a 23 y.o. male with h/o asthma who presents to the Emergency Department complaining of persistent cough that has been present for 3 days. Pt claims that last night he had "bad" wheezing". Pt uses albuterol inhaler, 3x a day and ran out of his inhaler several days ago.  At baseline he reports using his inhaler at least once daily. He reports that the cough is productive, yellow sputum. He also reports nasal congestion, itchy throat and eyes,  Denies sneezing. Pt states that he experiences similar symptoms during the spring, summer months every year. He denies fever, also denies chest pain, nausea, vomiting and abdominal pain.   Past Medical History  Diagnosis Date  . Asthma   . Depression   . Headache(784.0)     Migranes  . Bipolar 1 disorder   . Personality disorder   . Chronic back pain    History reviewed. No pertinent past surgical history. Family History  Problem Relation Age of Onset  . Stroke Paternal Grandfather     grandparents  . Heart disease Other     grandparents  . Hypertension Other     grandparents and other relative  . Kidney disease Other     grandparents  . Diabetes Other     grandparents  . Asthma Other     grandparents  . Cancer Neg Hx   . Hypertension Father   . Asthma Sister    History  Substance Use Topics  . Smoking status: Current Every Day Smoker    Types: Cigarettes  . Smokeless tobacco: Not on file  . Alcohol Use: No    Review of Systems  Constitutional: Negative for fever.  HENT: Positive for congestion and  rhinorrhea. Negative for sore throat.   Eyes: Negative.   Respiratory: Positive for cough, shortness of breath and wheezing. Negative for chest tightness.   Cardiovascular: Negative for chest pain.  Gastrointestinal: Negative for nausea and abdominal pain.  Genitourinary: Negative.   Musculoskeletal: Negative for arthralgias, joint swelling and neck pain.  Skin: Negative.  Negative for rash and wound.  Neurological: Negative for dizziness, weakness, light-headedness, numbness and headaches.  Psychiatric/Behavioral: Negative.   All other systems reviewed and are negative.     Allergies  Review of patient's allergies indicates no known allergies.  Home Medications   Prior to Admission medications   Medication Sig Start Date End Date Taking? Authorizing Provider  albuterol (PROVENTIL HFA;VENTOLIN HFA) 108 (90 BASE) MCG/ACT inhaler Inhale 2 puffs into the lungs every 6 (six) hours as needed for wheezing. 10/14/13   Robert KeensMae E Haliburton, FNP  carbamazepine (TEGRETOL) 200 MG tablet Take 1 tablet (200 mg total) by mouth 2 (two) times daily. 01/18/13   Robert LennertJoseph L Zammit, MD   BP 128/78  Pulse 96  Temp(Src) 97.7 F (36.5 C) (Oral)  Resp 20  Ht 5\' 9"  (1.753 m)  Wt 137 lb (62.143 kg)  BMI 20.22 kg/m2  SpO2 97% Physical Exam  Nursing note and vitals reviewed. Constitutional: He is oriented to person,  place, and time. He appears well-developed and well-nourished.  HENT:  Head: Normocephalic and atraumatic.  Right Ear: Tympanic membrane and ear canal normal.  Left Ear: Tympanic membrane and ear canal normal.  Nose: Mucosal edema and rhinorrhea present.  Mouth/Throat: Uvula is midline, oropharynx is clear and moist and mucous membranes are normal. No oropharyngeal exudate, posterior oropharyngeal edema, posterior oropharyngeal erythema or tonsillar abscesses.  Nasal congestion   Eyes: Conjunctivae are normal. Pupils are equal, round, and reactive to light.  Neck: Normal range of motion.   Cardiovascular: Normal rate and normal heart sounds.   Pulmonary/Chest: Effort normal. No respiratory distress. He has no wheezes. He has no rales.  Slightly reduced breath sounds throughout all lung fields.  No rhonchi  Abdominal: Soft. There is no tenderness.  Musculoskeletal: Normal range of motion.  Lymphadenopathy:    He has no cervical adenopathy.  Neurological: He is alert and oriented to person, place, and time.  Skin: Skin is warm and dry. No rash noted.  Psychiatric: He has a normal mood and affect.    ED Course  Procedures (including critical care time) DIAGNOSTIC STUDIES: Oxygen Saturation is 97% on RA, normal by my interpretation.    COORDINATION OF CARE:   9:17 AM-Discussed treatment plan which includes a breathing treatment, prednisone, allergy medication with pt at bedside and pt agreed to plan.   10:13 AM-Pt feels better after his breathing treatment. Better aeration and no wheezes.   Labs Review Labs Reviewed - No data to display  Imaging Review No results found.   EKG Interpretation None      MDM   Final diagnoses:  Asthma flare  Seasonal allergies   I personally performed the services described in this documentation, which was scribed in my presence. The recorded information has been reviewed and is accurate.   Pt was prescribed prednisone 6 day taper, albuterol mdi, zyrtec d.  Suspect patient having asthma flare as complication of seasonal allergies.  Encouraged f/u with pcp prn,  Returning here for any worsened sx with this exacerbation.  [facility].  Pt stable throughout ed visit and at time of dc.     [name] Christyna Letendre, PA-C 11/10/13 2235

## 2013-11-09 NOTE — Discharge Instructions (Signed)
Allergic Rhinitis Allergic rhinitis is when the mucous membranes in the nose respond to allergens. Allergens are particles in the air that cause your body to have an allergic reaction. This causes you to release allergic antibodies. Through a chain of events, these eventually cause you to release histamine into the blood stream. Although meant to protect the body, it is this release of histamine that causes your discomfort, such as frequent sneezing, congestion, and an itchy, runny nose.  CAUSES  Seasonal allergic rhinitis (hay fever) is caused by pollen allergens that may come from grasses, trees, and weeds. Year-round allergic rhinitis (perennial allergic rhinitis) is caused by allergens such as house dust mites, pet dander, and mold spores.  SYMPTOMS   Nasal stuffiness (congestion).  Itchy, runny nose with sneezing and tearing of the eyes. DIAGNOSIS  Your health care provider can help you determine the allergen or allergens that trigger your symptoms. If you and your health care provider are unable to determine the allergen, skin or blood testing may be used. TREATMENT  Allergic Rhinitis does not have a cure, but it can be controlled by:  Medicines and allergy shots (immunotherapy).  Avoiding the allergen. Hay fever may often be treated with antihistamines in pill or nasal spray forms. Antihistamines block the effects of histamine. There are over-the-counter medicines that may help with nasal congestion and swelling around the eyes. Check with your health care provider before taking or giving this medicine.  If avoiding the allergen or the medicine prescribed do not work, there are many new medicines your health care provider can prescribe. Stronger medicine may be used if initial measures are ineffective. Desensitizing injections can be used if medicine and avoidance does not work. Desensitization is when a patient is given ongoing shots until the body becomes less sensitive to the allergen.  Make sure you follow up with your health care provider if problems continue. HOME CARE INSTRUCTIONS It is not possible to completely avoid allergens, but you can reduce your symptoms by taking steps to limit your exposure to them. It helps to know exactly what you are allergic to so that you can avoid your specific triggers. SEEK MEDICAL CARE IF:   You have a fever.  You develop a cough that does not stop easily (persistent).  You have shortness of breath.  You start wheezing.  Symptoms interfere with normal daily activities. Document Released: 04/03/2001 Document Revised: 04/29/2013 Document Reviewed: 03/16/2013 [facility] Patient Information 2014 [place], [place].  Asthma Attack Prevention Although there is no way to prevent asthma from starting, you can take steps to control the disease and reduce its symptoms. Learn about your asthma and how to control it. Take an active role to control your asthma by working with your health care provider to create and follow an asthma action plan. An asthma action plan guides you in:  Taking your medicines properly.  Avoiding things that set off your asthma or make your asthma worse (asthma triggers).  Tracking your level of asthma control.  Responding to worsening asthma.  Seeking emergency care when needed. To track your asthma, keep records of your symptoms, check your peak flow number using a handheld device that shows how well air moves out of your lungs (peak flow meter), and get regular asthma checkups.  WHAT ARE SOME WAYS TO PREVENT AN ASTHMA ATTACK?  Take medicines as directed by your health care provider.  Keep track of your asthma symptoms and level of control.  With your health care provider, write a  detailed plan for taking medicines and managing an asthma attack. Then be sure to follow your action plan. Asthma is an ongoing condition that needs regular monitoring and treatment.  Identify and avoid asthma triggers. Many outdoor  allergens and irritants (such as pollen, mold, cold air, and air pollution) can trigger asthma attacks. Find out what your asthma triggers are and take steps to avoid them.  Monitor your breathing. Learn to recognize warning signs of an attack, such as coughing, wheezing, or shortness of breath. Your lung function may decrease before you notice any signs or symptoms, so regularly measure and record your peak airflow with a home peak flow meter.  Identify and treat attacks early. If you act quickly, you are less likely to have a severe attack. You will also need less medicine to control your symptoms. When your peak flow measurements decrease and alert you to an upcoming attack, take your medicine as instructed and immediately stop any activity that may have triggered the attack. If your symptoms do not improve, get medical help.  Pay attention to increasing quick-relief inhaler use. If you find yourself relying on your quick-relief inhaler, your asthma is not under control. See your health care provider about adjusting your treatment. WHAT CAN MAKE MY SYMPTOMS WORSE? A number of common things can set off or make your asthma symptoms worse and cause temporary increased inflammation of your airways. Keep track of your asthma symptoms for several weeks, detailing all the environmental and emotional factors that are linked with your asthma. When you have an asthma attack, go back to your asthma diary to see which factor, or combination of factors, might have contributed to it. Once you know what these factors are, you can take steps to control many of them. If you have allergies and asthma, it is important to take asthma prevention steps at home. Minimizing contact with the substance to which you are allergic will help prevent an asthma attack. Some triggers and ways to avoid these triggers are: Animal Dander:  Some people are allergic to the flakes of skin or dried saliva from animals with fur or feathers.     There is no such thing as a hypoallergenic dog or cat breed. All dogs or cats can cause allergies, even if they don't shed.  Keep these pets out of your home.  If you are not able to keep a pet outdoors, keep the pet out of your bedroom and other sleeping areas at all times, and keep the door closed.  Remove carpets and furniture covered with cloth from your home. If that is not possible, keep the pet away from fabric-covered furniture and carpets. Dust Mites: Many people with asthma are allergic to dust mites. Dust mites are tiny bugs that are found in every home in mattresses, pillows, carpets, fabric-covered furniture, bedcovers, clothes, stuffed toys, and other fabric-covered items.   Cover your mattress in a special dust-proof cover.  Cover your pillow in a special dust-proof cover, or wash the pillow each week in hot water. Water must be hotter than 130 F (54.4 C) to kill dust mites. Cold or warm water used with detergent and bleach can also be effective.  Wash the sheets and blankets on your bed each week in hot water.  Try not to sleep or lie on cloth-covered cushions.  Call ahead when traveling and ask for a smoke-free hotel room. Bring your own bedding and pillows in case the hotel only supplies feather pillows and down comforters, which  may contain dust mites and cause asthma symptoms.  Remove carpets from your bedroom and those laid on concrete, if you can.  Keep stuffed toys out of the bed, or wash the toys weekly in hot water or cooler water with detergent and bleach. Cockroaches: Many people with asthma are allergic to the droppings and remains of cockroaches.   Keep food and garbage in closed containers. Never leave food out.  Use poison baits, traps, powders, gels, or paste (for example, boric acid).  If a spray is used to kill cockroaches, stay out of the room until the odor goes away. Indoor Mold:  Fix leaky faucets, pipes, or other sources of water that have  mold around them.  Clean floors and moldy surfaces with a fungicide or diluted bleach.  Avoid using humidifiers, vaporizers, or swamp coolers. These can spread molds through the air. Pollen and Outdoor Mold:  When pollen or mold spore counts are high, try to keep your windows closed.  Stay indoors with windows closed from late morning to afternoon. Pollen and some mold spore counts are highest at that time.  Ask your health care provider whether you need to take anti-inflammatory medicine or increase your dose of the medicine before your allergy season starts. Other Irritants to Avoid:  Tobacco smoke is an irritant. If you smoke, ask your health care provider how you can quit. Ask family members to quit smoking too. Do not allow smoking in your home or car.  If possible, do not use a wood-burning stove, kerosene heater, or fireplace. Minimize exposure to all sources of smoke, including to incense, candles, fires, and fireworks.  Try to stay away from strong odors and sprays, such as perfume, talcum powder, hair spray, and paints.  Decrease humidity in your home and use an indoor air cleaning device. Reduce indoor humidity to below 60%. Dehumidifiers or central air conditioners can do this.  Decrease house dust exposure by changing furnace and air cooler filters frequently.  Try to have someone else vacuum for you once or twice a week. Stay out of rooms while they are being vacuumed and for a short while afterward.  If you vacuum, use a dust mask from a hardware store, a double-layered or microfilter vacuum cleaner bag, or a vacuum cleaner with a HEPA filter.  Sulfites in foods and beverages can be irritants. Do not drink beer or wine or eat dried fruit, processed potatoes, or shrimp if they cause asthma symptoms.  Cold air can trigger an asthma attack. Cover your nose and mouth with a scarf on cold or windy days.  Several health conditions can make asthma more difficult to manage,  including a runny nose, sinus infections, reflux disease, psychological stress, and sleep apnea. Work with your health care provider to manage these conditions.  Avoid close contact with people who have a respiratory infection such as a cold or the flu, since your asthma symptoms may get worse if you catch the infection. Wash your hands thoroughly after touching items that may have been handled by people with a respiratory infection.  Get a flu shot every year to protect against the flu virus, which often makes asthma worse for days or weeks. Also get a pneumonia shot if you have not previously had one. Unlike the flu shot, the pneumonia shot does not need to be given yearly. Medicines:  Talk to your health care provider about whether it is safe for you to take aspirin or non-steroidal anti-inflammatory medicines (NSAIDs). In a  small number of people with asthma, aspirin and NSAIDs can cause asthma attacks. These medicines must be avoided by people who have known aspirin-sensitive asthma. It is important that people with aspirin-sensitive asthma read labels of all over-the-counter medicines used to treat pain, colds, coughs, and fever.  Beta blockers and ACE inhibitors are other medicines you should discuss with your health care provider. HOW CAN I FIND OUT WHAT I AM ALLERGIC TO? Ask your asthma health care provider about allergy skin testing or blood testing (the RAST test) to identify the allergens to which you are sensitive. If you are found to have allergies, the most important thing to do is to try to avoid exposure to any allergens that you are sensitive to as much as possible. Other treatments for allergies, such as medicines and allergy shots (immunotherapy) are available.  CAN I EXERCISE? Follow your health care provider's advice regarding asthma treatment before exercising. It is important to maintain a regular exercise program, but vigorous exercise, or exercise in cold, humid, or dry  environments can cause asthma attacks, especially for those people who have exercise-induced asthma. Document Released: 06/27/2009 Document Revised: 03/11/2013 Document Reviewed: 01/14/2013 [facility] Patient Information 2014 [place], [place].   Take your next dose of prednisone tomorrow morning.  Start taking the zyrtec allergy medicine today.

## 2013-11-12 NOTE — ED Provider Notes (Signed)
Medical screening examination/treatment/procedure(s) were performed by non-physician practitioner and as supervising physician I was immediately available for consultation/collaboration.   EKG Interpretation None        Broox Lonigro L Kamilo Och, MD 11/12/13 1934 

## 2014-02-26 ENCOUNTER — Ambulatory Visit: Payer: Managed Care, Other (non HMO) | Admitting: Family Medicine

## 2014-04-14 ENCOUNTER — Ambulatory Visit: Payer: Managed Care, Other (non HMO) | Admitting: Family Medicine

## 2014-08-04 ENCOUNTER — Encounter: Payer: Self-pay | Admitting: Family

## 2014-08-04 ENCOUNTER — Ambulatory Visit (INDEPENDENT_AMBULATORY_CARE_PROVIDER_SITE_OTHER): Payer: Managed Care, Other (non HMO) | Admitting: Family

## 2014-08-04 VITALS — BP 119/75 | HR 75 | Temp 97.4°F | Ht 69.0 in | Wt 141.6 lb

## 2014-08-04 DIAGNOSIS — F329 Major depressive disorder, single episode, unspecified: Secondary | ICD-10-CM

## 2014-08-04 DIAGNOSIS — F32A Depression, unspecified: Secondary | ICD-10-CM

## 2014-08-04 DIAGNOSIS — J4520 Mild intermittent asthma, uncomplicated: Secondary | ICD-10-CM

## 2014-08-04 MED ORDER — CETIRIZINE-PSEUDOEPHEDRINE ER 5-120 MG PO TB12: 1.0000 | ORAL_TABLET | Freq: Two times a day (BID) | ORAL | Status: DC

## 2014-08-04 MED ORDER — ESCITALOPRAM OXALATE 20 MG PO TABS: 20.0000 mg | ORAL_TABLET | Freq: Every day | ORAL | Status: DC

## 2014-08-04 MED ORDER — ALBUTEROL SULFATE HFA 108 (90 BASE) MCG/ACT IN AERS: 1.0000 | INHALATION_SPRAY | Freq: Four times a day (QID) | RESPIRATORY_TRACT | Status: DC | PRN

## 2014-08-04 NOTE — Patient Instructions (Signed)
Depression Depression refers to feeling sad, low, down in the dumps, blue, gloomy, or empty. In general, there are two kinds of depression: 1. Normal sadness or normal grief. This kind of depression is one that we all feel from time to time after upsetting life experiences, such as the loss of a job or the ending of a relationship. This kind of depression is considered normal, is short lived, and resolves within a few days to 2 weeks. Depression experienced after the loss of a loved one (bereavement) often lasts longer than 2 weeks but normally gets better with time. 2. Clinical depression. This kind of depression lasts longer than normal sadness or normal grief or interferes with your ability to function at home, at work, and in school. It also interferes with your personal relationships. It affects almost every aspect of your life. Clinical depression is an illness. Symptoms of depression can also be caused by conditions other than those mentioned above, such as:  Physical illness. Some physical illnesses, including underactive thyroid gland (hypothyroidism), severe anemia, specific types of cancer, diabetes, uncontrolled seizures, heart and lung problems, strokes, and chronic pain are commonly associated with symptoms of depression.  Side effects of some prescription medicine. In some people, certain types of medicine can cause symptoms of depression.  Substance abuse. Abuse of alcohol and illicit drugs can cause symptoms of depression. SYMPTOMS Symptoms of normal sadness and normal grief include the following:  Feeling sad or crying for short periods of time.  Not caring about anything (apathy).  Difficulty sleeping or sleeping too much.  No longer able to enjoy the things you used to enjoy.  Desire to be by oneself all the time (social isolation).  Lack of energy or motivation.  Difficulty concentrating or remembering.  Change in appetite or weight.  Restlessness or  agitation. Symptoms of clinical depression include the same symptoms of normal sadness or normal grief and also the following symptoms:  Feeling sad or crying all the time.  Feelings of guilt or worthlessness.  Feelings of hopelessness or helplessness.  Thoughts of suicide or the desire to harm yourself (suicidal ideation).  Loss of touch with reality (psychotic symptoms). Seeing or hearing things that are not real (hallucinations) or having false beliefs about your life or the people around you (delusions and paranoia). DIAGNOSIS  The diagnosis of clinical depression is usually based on how bad the symptoms are and how long they have lasted. Your health care provider will also ask you questions about your medical history and substance use to find out if physical illness, use of prescription medicine, or substance abuse is causing your depression. Your health care provider may also order blood tests. TREATMENT  Often, normal sadness and normal grief do not require treatment. However, sometimes antidepressant medicine is given for bereavement to ease the depressive symptoms until they resolve. The treatment for clinical depression depends on how bad the symptoms are but often includes antidepressant medicine, counseling with a mental health professional, or both. Your health care provider will help to determine what treatment is best for you. Depression caused by physical illness usually goes away with appropriate medical treatment of the illness. If prescription medicine is causing depression, talk with your health care provider about stopping the medicine, decreasing the dose, or changing to another medicine. Depression caused by the abuse of alcohol or illicit drugs goes away when you stop using these substances. Some adults need professional help in order to stop drinking or using drugs. SEEK IMMEDIATE MEDICAL   CARE IF:  You have thoughts about hurting yourself or others.  You lose touch  with reality (have psychotic symptoms).  You are taking medicine for depression and have a serious side effect. FOR MORE INFORMATION  National Alliance on Mental Illness: www.nami.[employer] of Mental Health: http://www.maynard.net/ Document Released: 07/06/2000 Document Revised: 11/23/2013 Document Reviewed: 10/08/2011 [facility] Patient Information 2015 [place], [place]. This information is not intended to replace advice given to you by your health care provider. Make sure you discuss any questions you have with your health care provider. Asthma Asthma is a recurring condition in which the airways tighten and narrow. Asthma can make it difficult to breathe. It can cause coughing, wheezing, and shortness of breath. Asthma episodes, also called asthma attacks, range from minor to life-threatening. Asthma cannot be cured, but medicines and lifestyle changes can help control it. CAUSES Asthma is believed to be caused by inherited (genetic) and environmental factors, but its exact cause is unknown. Asthma may be triggered by allergens, lung infections, or irritants in the air. Asthma triggers are different for each person. Common triggers include:  3. Animal dander. 4. Dust mites. 5. Cockroaches. 6. Pollen from trees or grass. 7. Mold. 8. Smoke. 9. Air pollutants such as dust, household cleaners, hair sprays, aerosol sprays, paint fumes, strong chemicals, or strong odors. 10. Cold air, weather changes, and winds (which increase molds and pollens in the air). 11. Strong emotional expressions such as crying or laughing hard. 12. Stress. 13. Certain medicines (such as aspirin) or types of drugs (such as beta-blockers). 14. Sulfites in foods and drinks. Foods and drinks that may contain sulfites include dried fruit, potato chips, and sparkling grape juice. 15. Infections or inflammatory conditions such as the flu, a cold, or an inflammation of the nasal membranes  (rhinitis). 16. Gastroesophageal reflux disease (GERD). 17. Exercise or strenuous activity. SYMPTOMS Symptoms may occur immediately after asthma is triggered or many hours later. Symptoms include:  Wheezing.  Excessive nighttime or early morning coughing.  Frequent or severe coughing with a common cold.  Chest tightness.  Shortness of breath. DIAGNOSIS  The diagnosis of asthma is made by a review of your medical history and a physical exam. Tests may also be performed. These may include:  Lung function studies. These tests show how much air you breathe in and out.  Allergy tests.  Imaging tests such as X-rays. TREATMENT  Asthma cannot be cured, but it can usually be controlled. Treatment involves identifying and avoiding your asthma triggers. It also involves medicines. There are 2 classes of medicine used for asthma treatment:   Controller medicines. These prevent asthma symptoms from occurring. They are usually taken every day.  Reliever or rescue medicines. These quickly relieve asthma symptoms. They are used as needed and provide short-term relief. Your health care provider will help you create an asthma action plan. An asthma action plan is a written plan for managing and treating your asthma attacks. It includes a list of your asthma triggers and how they may be avoided. It also includes information on when medicines should be taken and when their dosage should be changed. An action plan may also involve the use of a device called a peak flow meter. A peak flow meter measures how well the lungs are working. It helps you monitor your condition. HOME CARE INSTRUCTIONS   Take medicines only as directed by your health care provider. Speak with your health care provider if you have questions about how or when  to take the medicines.  Use a peak flow meter as directed by your health care provider. Record and keep track of readings.  Understand and use the action plan to help  minimize or stop an asthma attack without needing to seek medical care.  Control your home environment in the following ways to help prevent asthma attacks:  Do not smoke. Avoid being exposed to secondhand smoke.  Change your heating and air conditioning filter regularly.  Limit your use of fireplaces and wood stoves.  Get rid of pests (such as roaches and mice) and their droppings.  Throw away plants if you see mold on them.  Clean your floors and dust regularly. Use unscented cleaning products.  Try to have someone else vacuum for you regularly. Stay out of rooms while they are being vacuumed and for a short while afterward. If you vacuum, use a dust mask from a hardware store, a double-layered or microfilter vacuum cleaner bag, or a vacuum cleaner with a HEPA filter.  Replace carpet with wood, tile, or vinyl flooring. Carpet can trap dander and dust.  Use allergy-proof pillows, mattress covers, and box spring covers.  Wash bed sheets and blankets every week in hot water and dry them in a dryer.  Use blankets that are made of polyester or cotton.  Clean bathrooms and kitchens with bleach. If possible, have someone repaint the walls in these rooms with mold-resistant paint. Keep out of the rooms that are being cleaned and painted.  Wash hands frequently. SEEK MEDICAL CARE IF:   You have wheezing, shortness of breath, or a cough even if taking medicine to prevent attacks.  The colored mucus you cough up (sputum) is thicker than usual.  Your sputum changes from clear or white to yellow, green, gray, or bloody.  You have any problems that may be related to the medicines you are taking (such as a rash, itching, swelling, or trouble breathing).  You are using a reliever medicine more than 2-3 times per week.  Your peak flow is still at 50-79% of your personal best after following your action plan for 1 hour.  You have a fever. SEEK IMMEDIATE MEDICAL CARE IF:   You seem to be  getting worse and are unresponsive to treatment during an asthma attack.  You are short of breath even at rest.  You get short of breath when doing very little physical activity.  You have difficulty eating, drinking, or talking due to asthma symptoms.  You develop chest pain.  You develop a fast heartbeat.  You have a bluish color to your lips or fingernails.  You are light-headed, dizzy, or faint.  Your peak flow is less than 50% of your personal best. MAKE SURE YOU:   Understand these instructions.  Will watch your condition.  Will get help right away if you are not doing well or get worse. Document Released: 07/09/2005 Document Revised: 11/23/2013 Document Reviewed: 02/05/2013 [facility] Patient Information 2015 [place], [place]. This information is not intended to replace advice given to you by your health care provider. Make sure you discuss any questions you have with your health care provider.

## 2014-08-04 NOTE — Progress Notes (Signed)
Subjective:    Patient ID: Robert Benton, male    DOB: January 10, 1991, 24 y.o.   MRN: [id number]  Asthma He complains of wheezing. There is no cough, hemoptysis or hoarse voice. This is a recurrent problem. The current episode started 1 to 4 weeks ago. The problem occurs intermittently. The problem has been waxing and waning. Pertinent negatives include no fever, headaches, nasal congestion, rhinorrhea or sneezing. His symptoms are not alleviated by oral steroids and rest (Pt was seen at urgent care last Tuesday and given oral steroids). His past medical history is significant for asthma.  Pt states he hasn't had to use albuterol inhaler since getting steroid from urgent care.  Pt states he has depression since 2011. Pt states he had to go the hospital in [place] for depression. He was started on medication ( can't remember name). He stopped taking medication at the end of 2014 because he did not have a doctor. Pt scored a 14 on depression screening. Pt denies any suicidal thoughts or ideas.    Review of Systems  Constitutional: Negative.  Negative for fever.  HENT: Negative.  Negative for hoarse voice, rhinorrhea and sneezing.   Respiratory: Positive for wheezing. Negative for cough and hemoptysis.   Cardiovascular: Negative.   Gastrointestinal: Negative.   Endocrine: Negative.   Genitourinary: Negative.   Musculoskeletal: Negative.   Neurological: Negative.  Negative for headaches.  Hematological: Negative.   Psychiatric/Behavioral: Positive for sleep disturbance and decreased concentration. Negative for suicidal ideas. The patient is nervous/anxious.   All other systems reviewed and are negative.      Objective:   Physical Exam  Constitutional: He is oriented to person, place, and time. He appears well-developed and well-nourished. No distress.  HENT:  Head: Normocephalic.  Right Ear: External ear normal.  Left Ear: External ear normal.  Nose: Nose normal.  Mouth/Throat:  Oropharynx is clear and moist.  Eyes: Pupils are equal, round, and reactive to light. Right eye exhibits no discharge. Left eye exhibits no discharge.  Neck: Normal range of motion. Neck supple. No thyromegaly present.  Cardiovascular: Normal rate, regular rhythm, normal heart sounds and intact distal pulses.   No murmur heard. Pulmonary/Chest: Effort normal and breath sounds normal. No respiratory distress. He has no wheezes.  Abdominal: Soft. Bowel sounds are normal. He exhibits no distension. There is no tenderness.  Musculoskeletal: Normal range of motion. He exhibits no edema or tenderness.  Neurological: He is alert and oriented to person, place, and time. He has normal reflexes. No cranial nerve deficit.  Skin: Skin is warm and dry. No rash noted. No erythema.  Psychiatric: He has a normal mood and affect. His behavior is normal. Judgment and thought content normal.  Vitals reviewed.   BP 119/75 mmHg  Pulse 75  Temp(Src) 97.4 F (36.3 C) (Oral)  Ht  (1.753 m)  Wt 141 lb 9.6 oz (64.229 kg)  BMI 20.90 kg/m2       Assessment & Plan:  1. Depression -RTO in 2 weeks for f/u Pt to go to ED if he has any suicidal thoughts or thoughts about harming others - escitalopram (LEXAPRO) 20 MG tablet; Take 1 tablet (20 mg total) by mouth daily.  Dispense: 30 tablet; Refill: 3  2. Asthma, chronic, mild intermittent, uncomplicated -Pt to take allergy medication every day -Avoid allergens that could cause asthma attack - cetirizine-pseudoephedrine (ZYRTEC-D) 5-120 MG per tablet; Take 1 tablet by mouth 2 (two) times daily.  Dispense: 60 tablet; Refill: 11 -  albuterol (PROVENTIL HFA;VENTOLIN HFA) 108 (90 BASE) MCG/ACT inhaler; Inhale 1-2 puffs into the lungs every 6 (six) hours as needed for wheezing or shortness of breath.  Dispense: 1 Inhaler; Refill: 0  Jannifer Rodneyhristy Vallerie Hentz, FNP

## 2014-08-05 ENCOUNTER — Ambulatory Visit: Payer: Managed Care, Other (non HMO) | Admitting: Family Medicine

## 2014-11-11 DIAGNOSIS — F331 Major depressive disorder, recurrent, moderate: Secondary | ICD-10-CM | POA: Insufficient documentation

## 2015-07-16 ENCOUNTER — Emergency Department (HOSPITAL_COMMUNITY)
Admission: EM | Admit: 2015-07-16 | Discharge: 2015-07-16 | Disposition: A | Discharge status: Home / Self Care | Payer: 59 | Attending: Emergency Medicine | Admitting: Emergency Medicine

## 2015-07-16 ENCOUNTER — Encounter (HOSPITAL_COMMUNITY): Payer: Self-pay | Admitting: Emergency Medicine

## 2015-07-16 DIAGNOSIS — G8929 Other chronic pain: Secondary | ICD-10-CM | POA: Insufficient documentation

## 2015-07-16 DIAGNOSIS — K0889 Other specified disorders of teeth and supporting structures: Secondary | ICD-10-CM | POA: Insufficient documentation

## 2015-07-16 DIAGNOSIS — J45909 Unspecified asthma, uncomplicated: Secondary | ICD-10-CM | POA: Diagnosis not present

## 2015-07-16 DIAGNOSIS — Z8659 Personal history of other mental and behavioral disorders: Secondary | ICD-10-CM | POA: Insufficient documentation

## 2015-07-16 DIAGNOSIS — K029 Dental caries, unspecified: Secondary | ICD-10-CM | POA: Diagnosis not present

## 2015-07-16 DIAGNOSIS — Z87891 Personal history of nicotine dependence: Secondary | ICD-10-CM | POA: Insufficient documentation

## 2015-07-16 DIAGNOSIS — Z79899 Other long term (current) drug therapy: Secondary | ICD-10-CM | POA: Insufficient documentation

## 2015-07-16 MED ORDER — NAPROXEN 250 MG PO TABS: 250.0000 mg | ORAL_TABLET | Freq: Two times a day (BID) | ORAL | Status: DC | PRN

## 2015-07-16 MED ORDER — PENICILLIN V POTASSIUM 250 MG PO TABS: 250.0000 mg | ORAL_TABLET | Freq: Four times a day (QID) | ORAL | Status: DC

## 2015-07-16 NOTE — Discharge Instructions (Signed)
°Emergency Department Resource Guide °1) Find a Doctor and Pay Out of Pocket °Although you won't have to find out who is covered by your insurance plan, it is a good idea to ask around and get recommendations. You will then need to call the office and see if the doctor you have chosen will accept you as a new patient and what types of options they offer for patients who are self-pay. Some doctors offer discounts or will set up payment plans for their patients who do not have insurance, but you will need to ask so you aren't surprised when you get to your appointment. ° °2) Contact Your Local Health Department °Not all health departments have doctors that can see patients for sick visits, but many do, so it is worth a call to see if yours does. If you don't know where your local health department is, you can check in your phone book. The CDC also has a tool to help you locate your state's health department, and many state websites also have listings of all of their local health departments. ° °3) Find a Walk-in Clinic °If your illness is not likely to be very severe or complicated, you may want to try a walk in clinic. These are popping up all over the country in pharmacies, drugstores, and shopping centers. They're usually staffed by nurse practitioners or physician assistants that have been trained to treat common illnesses and complaints. They're usually fairly quick and inexpensive. However, if you have serious medical issues or chronic medical problems, these are probably not your best option. ° °No Primary Care Doctor: °- Call [facility] at  832-8000 - they can help you locate a primary care doctor that  accepts your insurance, provides certain services, etc. °- Physician Referral Service- 1-800-533-3463 ° °Chronic Pain Problems: °Organization         Address  Phone   Notes  °[facility]  (336) 297-2271 Patients need to be referred by their primary care doctor.  ° °Medication  Assistance: °Organization         Address  Phone   Notes  °[place] [facility] [address] °[place], [place] [address] (336) 641-8030 --Must be a resident of [place] °-- Must have NO insurance coverage whatsoever (no Medicaid/ Medicare, etc.) °-- The pt. MUST have a primary care doctor that directs their care regularly and follows them in the community °  °[facility]  (866) 331-1348   °[employer]  (888) 892-1162   ° °Agencies that provide inexpensive medical care: °Organization         Address  Phone   Notes  °[facility]  (336) 832-8035   °[facility]    (336) 832-7272   °[facility] [address] °[place], [place] [address] (336) 832-4777   °[facility] [address], °[place] (336) 271-4999   °[facility]    (336) 373-0678   °[facility]    (336) 272-1050   °[facility] ° [address], [place] Phone:  (336) 832-4444, Fax:  (336) 832-4440 Hours of Operation:  9 am - 6 pm, M-F.  Also accepts Medicaid/Medicare and self-pay.  °[facility] ° [address], [place] Phone: (336) 832-3150, Fax: (336) 832-3151. Hours of Operation:  8:30 am - 5:30 pm, M-F.  Also accepts Medicaid and self-pay.  °[facility] [place] [address], [place] Phone: (336) 878-6027   °[facility] [address], [place], [place] (336)723-1848, Ext. 123 Mondays & Thursdays: 7-9 AM.  First 15 patients are seen on a first come, first serve basis. °  ° °Medicaid-accepting [place] Providers: ° °Organization         Address  Phone   Notes  °[facility] [address], [place] (336) 641-2100 Also accepts self-pay patients.  °[facility] [address], [place] ° (336) 856-9996   °[facility] [address], [place]  (336) 288-8857   °[facility] [address] [place] [address], [place] (336) 299-7000   °Veita Bland [address], [place]  ° (336) 373-1557 Only accepts [place] Access Medicaid patients after they have their name applied to their card.  ° °Self-Pay (no insurance) in [place]: ° °Organization         Address  Phone   Notes  °Sickle Cell Patients, [facility] [address], [place] (336) 832-1970   °[facility] [address], [place] (336) 832-4400   °[facility] ° [address], [place] (336) 992-4800   °[facility]/Dr. Osei-Bonsu ° [address] [place] [address], [place] or [address], [place] (336) 841-8500 Phone number for both [place] and [place] locations is the same.  °[facility] [address], [place] (336) 299-0000   °[facility] [address] [place] [address], [place] or [address] (336) 852-7530 °(336) 878-2260   °[facility] [address], [place] (336) 350-1642, phone; (336) 294-5005, fax Sees patients 1st and 3rd Saturday of every month.  Must not qualify for public or private insurance (i.e. Medicaid, Medicare, [place] Health Choice, Veterans' Benefits) • Household income should be no more than 200% of the poverty level •The clinic cannot treat you if you are pregnant or think you are pregnant • Sexually transmitted diseases are not treated at the clinic.  ° ° °Dental Care: °Organization         Address  Phone  Notes  °[place] [facility] [address], [place] (336) 641-6152 Accepts children up to age 21 who are enrolled in Medicaid or [place] Health Choice; pregnant women with a Medicaid card; and children who have applied for Medicaid or [place] Health Choice, but were declined, whose parents can pay a reduced fee at time of service.  °[place]  [facility] [place]  [address], [place] (336) 641-7733 Accepts children up to age 21 who are enrolled in Medicaid or [place] Health Choice; pregnant women with a Medicaid card; and children who have applied for Medicaid or [place] Health Choice, but were declined, whose parents can pay a reduced fee at time of service.  °[facility] ° [address], [place] (336) 641-4533 Patients are seen by appointment only. Walk-ins are not accepted. [facility] will see patients 18 years of age and older. °Monday - Tuesday (8am-5pm) °Most Wednesdays (8:30-5pm) °$30 per visit, cash only  °[facility] ° [address], [place] (336) 641-4533 Patients are seen by appointment only. Walk-ins are not accepted. [facility] will see patients 18 years of age and older. °One   Wednesday Evening (Monthly: Volunteer Based).  $30 per visit, cash only  °[facility]  (919) 537-3737 for adults; Children under age 4, call Graduate Pediatric Dentistry at (919) [phone]. Children aged 4-14, please call (919) [phone] to request a pediatric application. ° Dental services are provided in all areas of dental care including fillings, crowns and bridges, complete and partial dentures, implants, gum treatment, root canals, and extractions. Preventive care is also provided. Treatment is provided to both adults and children. °Patients are selected via a lottery and there is often a waiting list. °  °[facility] [address], °[place] ° (336) 763-8833 [email or web address] °  °[facility] [address], [place], [place] (336)723-1848, Ext. 123 Second and Fourth Thursday of each month, opens at 6:30 AM; Clinic ends at 9 AM.  Patients are seen on a first-come first-served basis, and a limited number are seen during each clinic.  ° °[facility] ° [address], [place], [place] (336) 723-7904    Eligibility Requirements °You must have lived in [place], [place], or [place] counties for at least the last three months. °  You cannot be eligible for state or federal sponsored healthcare insurance, including Veterans Administration, Medicaid, or Medicare. °  You generally cannot be eligible for healthcare insurance through your employer.  °  How to apply: °Eligibility screenings are held every Tuesday and Wednesday afternoon from 1:00 pm until 4:00 pm. You do not need an appointment for the interview!  °[facility] [address], [place], [place] 336-631-2330   °[facility]  336-342-8273   °[facility]  336-703-3100   °[facility]  336-570-6415   ° °Behavioral Health Resources in the Community: °Intensive Outpatient Programs °Organization         Address  Phone  Notes  °[facility] [address], [place], [place] 336-878-6098   °[facility] [address], [place], [place] 336-832-9800   °[facility] [address], [place], [place] ° 336-882-2125   °[facility] [address],  °[place], [place] 1-800-853-5163 or 336-641-4981   °Substance Abuse Resources °Organization         Address  Phone  Notes  °[facility]  336-882-2125   °[facility]  336-784-9470   °[facility]  336-285-9073   °[facility]  336-845-3988   °Residential & Outpatient Substance Abuse Program  1-800-659-3381   °Psychological Services °Organization         Address  Phone  Notes  °[facility]  336- 832-9600   °[facility]  336- 378-7881   °[facility] [address], [place] 1-800-853-5163 or 336-641-4981   ° °Mobile Crisis Teams °Organization         Address  Phone  Notes  °[facility], Mobile Crisis Care Unit  1-877-626-1772   °Assertive °[facility] ° [address]  [place], [place] 336-834-9664   °Sharon DeEsch [address] °[place] 336-554-5454   ° °Self-Help/Support Groups °Organization         Address  Phone             Notes  °[facility]  - variety of support groups  336- 373-1402 Call for more information  °Narcotics Anonymous (NA), [facility] [address], °[place]  2 meetings at this location  ° °  Residential Treatment Programs Organization         Address  Phone  Notes  [facility] [address],    [place]  [phone]   [facility]  [address], [place] [phone], [place], [place] [phone]   [facility] [address], [place] [phone] Admissions: 8am-3pm M-F  [facility] [address]    [place], [place] 295-284-1324([phone]   [facility] [address], [place], [place] [phone]   [facility] [address]  [place], [place] [phone]   [facility] Intensive Outpatient [address], [place], [place] [phone]   [facility] ([facility] [address]  [place], [place] [phone] or ([phone]   [facility] [address] [place], [place] [phone] Accepts Medicaid  [facility] [address]  [place] [phone] Substance Abuse/Addiction Treatment   [facility]         Address  Phone  Notes  [facility]  410-322-3554(888) 915-229-7404   Angie FavaJulie Brannon, PhD [address], [address] [place], [place]   [phone](336) [phone] or [phone](336) ([phone]   [facility]   [address] [place], [place] ([phone])[phone](336) [phone]   [facility] [address], [place], [place] ([phone])[phone](336) ([phone] Insurance/Medicaid/sponsorship through [employer] [address]                                    [place], [place] [phone](336) [phone] Therapy/tele-psych/case    [facility] [address]   [place], [place] ([phone](336) [phone]    Dr. Lolly MustacheArfeen  ([phone](336) [phone]   [facility] [place] [facility]  [employer] [facility] 1) [address], [place] 2) [address], [place] 3)  [address], [place] [phone](336) [phone](336) [phone]  [phone](336) [phone]   [facility] Child Abuse [facility] 681-331-2913(336) 6627136649 or [phone](336) ([phone])[phone] (After Hours)      Take the prescriptions as directed. May also take over the counter tylenol, as directed on packaging, as needed for discomfort. Call your regular Dentist on Tuesday to schedule a follow up appointment within the next week.  Return to the Emergency Department immediately sooner if worsening.

## 2015-07-16 NOTE — ED Notes (Signed)
Dental pain to right lower teeth.  Rates pain 10/10.

## 2015-07-16 NOTE — ED Provider Notes (Signed)
CSN: [id number]     Arrival date & time 07/16/15  1943 History   First MD Initiated Contact with Patient 07/16/15 1951     Chief Complaint  Patient presents with  . Dental Pain     HPI Pt was seen at 1950. Per pt, c/o gradual onset and persistence of constant right lower tooth "pain" for the past 4 days.  Denies fevers, no intra-oral edema, no rash, no facial swelling, no dysphagia, no neck pain.   The condition is aggravated by nothing. The condition is relieved by nothing. The symptoms have been associated with no other complaints. The patient has no significant history of serious medical conditions.     Past Medical History  Diagnosis Date  . Asthma   . Depression   . Headache(784.0)     Migranes  . Bipolar 1 disorder (HCC)   . Personality disorder   . Chronic back pain    History reviewed. No pertinent past surgical history.   Family History  Problem Relation Age of Onset  . Stroke Paternal Grandfather     grandparents  . Heart disease Other     grandparents  . Hypertension Other     grandparents and other relative  . Kidney disease Other     grandparents  . Diabetes Other     grandparents  . Asthma Other     grandparents  . Cancer Neg Hx   . Hypertension Father   . Asthma Sister    Social History  Substance Use Topics  . Smoking status: Former Smoker    Types: Cigarettes    Quit date: 07/29/2014  . Smokeless tobacco: None  . Alcohol Use: No    Review of Systems ROS: Statement: All systems negative except as marked or noted in the HPI; Constitutional: Negative for fever and chills. ; ; Eyes: Negative for eye pain and discharge. ; ; ENMT: Positive for dental caries, dental hygiene poor and toothache. Negative for ear pain, bleeding gums, dental injury, facial deformity, facial swelling, hoarseness, nasal congestion, sinus pressure, sore throat, throat swelling and tongue swollen. ; ; Cardiovascular: Negative for chest pain, palpitations, diaphoresis, dyspnea  and peripheral edema. ; ; Respiratory: Negative for cough, wheezing and stridor. ; ; Gastrointestinal: Negative for nausea, vomiting, diarrhea and abdominal pain. ; ; Genitourinary: Negative for dysuria, flank pain and hematuria. ; ; Musculoskeletal: Negative for back pain and neck pain. ; ; Skin: Negative for rash and skin lesion. ; ; Neuro: Negative for headache, lightheadedness and neck stiffness. ;     Allergies  Review of patient's allergies indicates no known allergies.  Home Medications   Prior to Admission medications   Medication Sig Start Date End Date Taking? Authorizing Provider  albuterol (PROVENTIL) (2.5 MG/3ML) 0.083% nebulizer solution 2.5 mg. 07/14/15 07/13/16 Yes Historical Provider, MD  albuterol (PROVENTIL HFA;VENTOLIN HFA) 108 (90 BASE) MCG/ACT inhaler Inhale 1-2 puffs into the lungs every 6 (six) hours as needed for wheezing or shortness of breath. 08/04/14   Junie Spencer, FNP  naproxen (NAPROSYN) 250 MG tablet Take 1 tablet (250 mg total) by mouth 2 (two) times daily as needed for mild pain or moderate pain (take with food). 07/16/15   Samuel Jester, DO  penicillin v potassium (VEETID) 250 MG tablet Take 1 tablet (250 mg total) by mouth 4 (four) times daily. 07/16/15   Samuel Jester, DO   Ht  (1.778 m)  Wt 145 lb (65.772 kg)  BMI 20.81 kg/m2 Physical Exam  1955:  Physical examination: Vital signs and O2 SAT: Reviewed; Constitutional: Well developed, Well nourished, Well hydrated, In no acute distress; Head and Face: Normocephalic, Atraumatic; Eyes: EOMI, PERRL, No scleral icterus; ENMT: Mouth and pharynx normal, Poor dentition, Widespread dental decay, Left TM normal, Right TM normal, Mucous membranes moist, +lower right 2nd molar with extensive dental decay.  No gingival erythema, edema, fluctuance, or drainage.  No intra-oral edema. No submandibular or sublingual edema. No hoarse voice, no drooling, no stridor. No trismus. ; Neck: Supple, Full range of  motion, No lymphadenopathy; Cardiovascular: Regular rate and rhythm, No murmur, rub, or gallop; Respiratory: Breath sounds clear & equal bilaterally, No rales, rhonchi, wheezes, Normal respiratory effort/excursion; Chest: Nontender, Movement normal; Extremities: Pulses normal, No tenderness, No edema; Neuro: AA&Ox3, Major CN grossly intact.  No gross focal motor or sensory deficits in extremities.; Skin: Color normal, No rash, No petechiae, Warm, Dry    ED Course  Procedures (including critical care time) Labs Review   Imaging Review  I have personally reviewed and evaluated these images and lab results as part of my medical decision-making.   EKG Interpretation None      MDM  MDM Reviewed: previous chart, nursing note and vitals      2000:  Pt encouraged to f/u with dentist or oral surgeon for his dental needs for good continuity of care and definitive treatment.  Pt verb understanding.    Samuel JesterKathleen Archer Moist, DO 07/19/15 2209

## 2016-01-13 ENCOUNTER — Ambulatory Visit: Payer: Managed Care, Other (non HMO) | Admitting: Family Medicine

## 2016-01-16 ENCOUNTER — Encounter: Payer: Self-pay | Admitting: Family Medicine

## 2017-05-04 ENCOUNTER — Encounter (HOSPITAL_COMMUNITY): Payer: Self-pay | Admitting: *Deleted

## 2017-05-04 ENCOUNTER — Emergency Department (HOSPITAL_COMMUNITY): Payer: 59

## 2017-05-04 ENCOUNTER — Emergency Department (HOSPITAL_COMMUNITY)
Admission: EM | Admit: 2017-05-04 | Discharge: 2017-05-04 | Disposition: A | Discharge status: Home / Self Care | Payer: 59 | Attending: Emergency Medicine | Admitting: Emergency Medicine

## 2017-05-04 DIAGNOSIS — Z87891 Personal history of nicotine dependence: Secondary | ICD-10-CM | POA: Insufficient documentation

## 2017-05-04 DIAGNOSIS — Z79899 Other long term (current) drug therapy: Secondary | ICD-10-CM | POA: Diagnosis not present

## 2017-05-04 DIAGNOSIS — J4520 Mild intermittent asthma, uncomplicated: Secondary | ICD-10-CM | POA: Diagnosis not present

## 2017-05-04 DIAGNOSIS — J45909 Unspecified asthma, uncomplicated: Secondary | ICD-10-CM | POA: Diagnosis present

## 2017-05-04 MED ORDER — DEXAMETHASONE 4 MG PO TABS
10.0000 mg | ORAL_TABLET | Freq: Once | ORAL | Status: AC
  Administered 2017-05-04: 10 mg via ORAL
  Filled 2017-05-04: qty 3

## 2017-05-04 MED ORDER — LORATADINE 10 MG PO TABS: 10.0000 mg | ORAL_TABLET | Freq: Every day | ORAL | 0 refills | Status: DC

## 2017-05-04 MED ORDER — ALBUTEROL SULFATE HFA 108 (90 BASE) MCG/ACT IN AERS
2.0000 | INHALATION_SPRAY | RESPIRATORY_TRACT | Status: DC | PRN
  Administered 2017-05-04: 2 via RESPIRATORY_TRACT
  Filled 2017-05-04: qty 6.7

## 2017-05-04 MED ORDER — AEROCHAMBER PLUS FLO-VU LARGE MISC
1.0000 | Freq: Once | Status: AC
  Administered 2017-05-04: 1

## 2017-05-04 MED ORDER — ALBUTEROL SULFATE (2.5 MG/3ML) 0.083% IN NEBU
5.0000 mg | INHALATION_SOLUTION | Freq: Once | RESPIRATORY_TRACT | Status: AC
  Administered 2017-05-04: 5 mg via RESPIRATORY_TRACT

## 2017-05-04 NOTE — ED Triage Notes (Signed)
Pt having sob x 2 days, hx of  Asthma, denies cp or cough. Wheezing noted.

## 2017-05-04 NOTE — ED Provider Notes (Signed)
[facility]-EMERGENCY DEPT Provider Note   CSN: [id number] Arrival date & time: 05/04/17  0541     History   Chief Complaint Chief Complaint  Patient presents with  . Asthma    HPI Robert Benton is a 26 y.o. male.  26yo M w/ PMH including asthma, migraines, bipolar d/o who p/w SOB. Patient reports a few days of shortness of breath and wheezing consistent with previous asthma exacerbations. He denies any chest pain. He has had a mild cough and runny nose. No fever, vomiting, diarrhea, sick contacts. No medications prior to arrival, he is out of his albuterol inhaler.   The history is provided by the patient.  Asthma     Past Medical History:  Diagnosis Date  . Asthma   . Bipolar 1 disorder (HCC)   . Chronic back pain   . Depression   . Headache(784.0)    Migranes  . Personality disorder [facility])     Patient Active Problem List   Diagnosis Date Noted  . Routine general medical examination at a health care facility 06/08/2011    History reviewed. No pertinent surgical history.     Home Medications    Prior to Admission medications   Medication Sig Start Date End Date Taking? Authorizing Provider  albuterol (PROVENTIL HFA;VENTOLIN HFA) 108 (90 BASE) MCG/ACT inhaler Inhale 1-2 puffs into the lungs every 6 (six) hours as needed for wheezing or shortness of breath. 08/04/14   Junie Spencer, FNP  benzocaine (ORAJEL) 10 % mucosal gel Use as directed 1 application in the mouth or throat as needed for mouth pain.    [provider]  ibuprofen (ADVIL,MOTRIN) 200 MG tablet Take 200 mg by mouth every 6 (six) hours as needed for mild pain or moderate pain.    [provider]  loratadine (CLARITIN) 10 MG tablet Take 1 tablet (10 mg total) by mouth daily. 05/04/17   Little, Ambrose Finland, MD  naproxen (NAPROSYN) 250 MG tablet Take 1 tablet (250 mg total) by mouth 2 (two) times daily as needed for mild pain or moderate pain (take with food). 07/16/15   Samuel Jester, DO  penicillin v potassium (VEETID) 250 MG tablet Take 1 tablet (250 mg total) by mouth 4 (four) times daily. 07/16/15   Samuel Jester, DO    Family History Family History  Problem Relation Age of Onset  . Hypertension Father   . Asthma Sister   . Stroke Paternal Grandfather        grandparents  . Heart disease Other        grandparents  . Hypertension Other        grandparents and other relative  . Kidney disease Other        grandparents  . Diabetes Other        grandparents  . Asthma Other        grandparents  . Cancer Neg Hx     Social History Social History  Substance Use Topics  . Smoking status: Former Smoker    Types: Cigarettes    Quit date: 07/29/2014  . Smokeless tobacco: Never Used  . Alcohol use No     Allergies   Patient has no known allergies.   Review of Systems Review of Systems All other systems reviewed and are negative except that which was mentioned in HPI   Physical Exam Updated Vital Signs BP 131/85 (BP Location: Right Arm)   Pulse (!) 55   Temp 97.9 F (36.6 C) (Oral)  Resp 18   SpO2 100%   Physical Exam  Constitutional: He is oriented to person, place, and time. He appears well-developed and well-nourished. No distress.  HENT:  Head: Normocephalic and atraumatic.  Mouth/Throat: Oropharynx is clear and moist.  Moist mucous membranes  Eyes: Conjunctivae are normal.  Neck: Neck supple.  Cardiovascular: Normal rate, regular rhythm and normal heart sounds.   No murmur heard. Pulmonary/Chest: Effort normal and breath sounds normal. No respiratory distress.  Abdominal: Soft. Bowel sounds are normal. He exhibits no distension. There is no tenderness.  Musculoskeletal: He exhibits no edema.  Neurological: He is alert and oriented to person, place, and time.  Fluent speech  Skin: Skin is warm and dry.  Psychiatric: He has a normal mood and affect. Judgment normal.  Nursing note and vitals reviewed.    ED Treatments  / Results  Labs (all labs ordered are listed, but only abnormal results are displayed) Labs Reviewed - No data to display  EKG  EKG Interpretation None       Radiology Dg Chest 2 View  Result Date: 05/04/2017 CLINICAL DATA:  Acute onset of shortness of breath and wheezing. Initial encounter. EXAM: CHEST  2 VIEW COMPARISON:  Chest radiograph performed 02/20/2016 FINDINGS: The lungs are well-aerated. Mild peribronchial thickening is noted. There is no evidence of focal opacification, pleural effusion or pneumothorax. The heart is normal in size; the mediastinal contour is within normal limits. No acute osseous abnormalities are seen. IMPRESSION: Mild peribronchial thickening noted.  Lungs otherwise clear. Electronically Signed   By: Roanna Raider M.D.   On: 05/04/2017 07:02    Procedures Procedures (including critical care time)  Medications Ordered in ED Medications  dexamethasone (DECADRON) tablet 10 mg (not administered)  albuterol (PROVENTIL HFA;VENTOLIN HFA) 108 (90 Base) MCG/ACT inhaler 2 puff (not administered)  AEROCHAMBER PLUS FLO-VU LARGE MISC 1 each (not administered)  albuterol (PROVENTIL) (2.5 MG/3ML) 0.083% nebulizer solution 5 mg (5 mg Nebulization Given 05/04/17 0559)     Initial Impression / Assessment and Plan / ED Course  I have reviewed the triage vital signs and the nursing notes.  Pertinent imaging results that were available during my care of the patient were reviewed by me and considered in my medical decision making (see chart for details).     Pt w/ a few days of wheezing similar to previous asthma problems, out of inhaler. Well appearing w/ normal VS on my exam, no wheezes but he had already received albuterol and was reportedly wheezing on arrival. Gave decadron and an inhaler. He does endorse seasonal allergy sx, started on claritin. Return precautions given. Patient discharged in satisfactory condition.  Final Clinical Impressions(s) / ED Diagnoses    Final diagnoses:  Mild intermittent asthma, unspecified whether complicated    New Prescriptions New Prescriptions   LORATADINE (CLARITIN) 10 MG TABLET    Take 1 tablet (10 mg total) by mouth daily.     Little, Ambrose Finland, MD 05/04/17 [phone]

## 2017-05-04 NOTE — ED Notes (Signed)
Declined W/C at D/C and was escorted to lobby by RN. 

## 2018-04-11 ENCOUNTER — Emergency Department (HOSPITAL_COMMUNITY): Payer: Self-pay

## 2018-04-11 ENCOUNTER — Emergency Department (HOSPITAL_COMMUNITY)
Admission: EM | Admit: 2018-04-11 | Discharge: 2018-04-11 | Disposition: A | Discharge status: Home / Self Care | Payer: Self-pay | Attending: Emergency Medicine | Admitting: Emergency Medicine

## 2018-04-11 ENCOUNTER — Encounter (HOSPITAL_COMMUNITY): Payer: Self-pay | Admitting: Emergency Medicine

## 2018-04-11 ENCOUNTER — Other Ambulatory Visit: Payer: Self-pay

## 2018-04-11 DIAGNOSIS — J4521 Mild intermittent asthma with (acute) exacerbation: Secondary | ICD-10-CM | POA: Insufficient documentation

## 2018-04-11 DIAGNOSIS — F319 Bipolar disorder, unspecified: Secondary | ICD-10-CM | POA: Insufficient documentation

## 2018-04-11 DIAGNOSIS — Z79899 Other long term (current) drug therapy: Secondary | ICD-10-CM | POA: Insufficient documentation

## 2018-04-11 DIAGNOSIS — Z87891 Personal history of nicotine dependence: Secondary | ICD-10-CM | POA: Insufficient documentation

## 2018-04-11 MED ORDER — PREDNISONE 50 MG PO TABS
60.0000 mg | ORAL_TABLET | Freq: Once | ORAL | Status: AC
  Administered 2018-04-11: 60 mg via ORAL
  Filled 2018-04-11: qty 1

## 2018-04-11 MED ORDER — ALBUTEROL SULFATE HFA 108 (90 BASE) MCG/ACT IN AERS: 2.0000 | INHALATION_SPRAY | RESPIRATORY_TRACT | 2 refills | Status: DC | PRN

## 2018-04-11 MED ORDER — ALBUTEROL SULFATE (2.5 MG/3ML) 0.083% IN NEBU
2.5000 mg | INHALATION_SOLUTION | Freq: Once | RESPIRATORY_TRACT | Status: AC
  Administered 2018-04-11: 2.5 mg via RESPIRATORY_TRACT
  Filled 2018-04-11: qty 3

## 2018-04-11 MED ORDER — PREDNISONE 20 MG PO TABS: 40.0000 mg | ORAL_TABLET | Freq: Every day | ORAL | 0 refills | Status: DC

## 2018-04-11 MED ORDER — ALBUTEROL SULFATE HFA 108 (90 BASE) MCG/ACT IN AERS
2.0000 | INHALATION_SPRAY | RESPIRATORY_TRACT | Status: DC | PRN
  Administered 2018-04-11: 2 via RESPIRATORY_TRACT
  Filled 2018-04-11: qty 6.7

## 2018-04-11 MED ORDER — IPRATROPIUM-ALBUTEROL 0.5-2.5 (3) MG/3ML IN SOLN
3.0000 mL | Freq: Once | RESPIRATORY_TRACT | Status: AC
  Administered 2018-04-11: 3 mL via RESPIRATORY_TRACT
  Filled 2018-04-11: qty 3

## 2018-04-11 NOTE — ED Provider Notes (Signed)
[facility]ERGENCY DEPARTMENT Provider Note   CSN: [id number] Arrival date & time: 04/11/18  [id number]     History   Chief Complaint Chief Complaint  Patient presents with  . Shortness of Breath    HPI Robert Robert Benton is a 27 y.o. male.  Patient presents to the emergency department for evaluation of difficulty breathing.  Patient reports 3 of asthma, has not had an albuterol inhaler for some time.  He reports that he had not had problems with his asthma for some time, but recently the weather change and he has been having shortness of breath and wheezing since.  He has had some coughing but no sputum production.  No fever.     Past Medical History:  Diagnosis Date  . Asthma   . Bipolar 1 disorder (HCC)   . Chronic back pain   . Depression   . Headache(784.0)    Migranes  . Personality disorder [facility](HCC)     Patient Active Problem List   Diagnosis Date Noted  . Routine general medical examination at a health care facility 06/08/2011    History reviewed. No pertinent surgical history.      Home Medications    Prior to Admission medications   Medication Sig Start Date End Date Taking? Authorizing Provider  albuterol (PROVENTIL HFA;VENTOLIN HFA) 108 (90 BASE) MCG/ACT inhaler Inhale 1-2 puffs into the lungs every 6 (six) hours as needed for wheezing or shortness of breath. 08/04/14   Junie SpencerHawks, Christy A, FNP  albuterol (PROVENTIL HFA;VENTOLIN HFA) 108 (90 Base) MCG/ACT inhaler Inhale 2 puffs into the lungs every 4 (four) hours as needed for wheezing or shortness of breath. 04/11/18   Gilda CreasePollina, Johncarlo Maalouf J, MD  benzocaine (ORAJEL) 10 % mucosal gel Use as directed 1 application in the mouth or throat as needed for mouth pain.    [provider]  ibuprofen (ADVIL,MOTRIN) 200 MG tablet Take 200 mg by mouth every 6 (six) hours as needed for mild pain or moderate pain.    [provider]  loratadine (CLARITIN) 10 MG tablet Take 1 tablet (10 mg total) by mouth daily.  05/04/17   Little, Ambrose Finlandachel Morgan, MD  naproxen (NAPROSYN) 250 MG tablet Take 1 tablet (250 mg total) by mouth 2 (two) times daily as needed for mild pain or moderate pain (take with food). 07/16/15   Samuel JesterMcManus, Kathleen, DO  penicillin v potassium (VEETID) 250 MG tablet Take 1 tablet (250 mg total) by mouth 4 (four) times daily. 07/16/15   Samuel JesterMcManus, Kathleen, DO  predniSONE (DELTASONE) 20 MG tablet Take 2 tablets (40 mg total) by mouth daily with breakfast. 04/11/18   Berenice Oehlert, Canary Brimhristopher J, MD    Family History Family History  Problem Relation Age of Onset  . Hypertension Father   . Asthma Sister   . Stroke Paternal Grandfather        grandparents  . Heart disease Other        grandparents  . Hypertension Other        grandparents and other relative  . Kidney disease Other        grandparents  . Diabetes Other        grandparents  . Asthma Other        grandparents  . Cancer Neg Hx     Social History Social History   Tobacco Use  . Smoking status: Former Smoker    Types: Cigarettes    Last attempt to quit: 07/29/2014    Years since quitting:  3.7  . Smokeless tobacco: Never Used  Substance Use Topics  . Alcohol use: No    Alcohol/week: 0.0 standard drinks  . Drug use: Yes    Types: Marijuana     Allergies   Patient has no known allergies.   Review of Systems Review of Systems  Respiratory: Positive for cough, shortness of breath and wheezing.   All other systems reviewed and are negative.    Physical Exam Updated Vital Signs BP 119/81   Pulse 65   Temp 97.8 F (36.6 C) (Oral)   Resp 15   Ht 5\' 10"  (1.778 m)   Wt 61.2 kg   SpO2 98%   BMI 19.37 kg/m   Physical Exam  Constitutional: He is oriented to person, place, and time. He appears well-developed and well-nourished. No distress.  HENT:  Head: Normocephalic and atraumatic.  Right Ear: Hearing normal.  Left Ear: Hearing normal.  Nose: Nose normal.  Mouth/Throat: Oropharynx is clear and moist and  mucous membranes are normal.  Eyes: Pupils are equal, round, and reactive to light. Conjunctivae and EOM are normal.  Neck: Normal range of motion. Neck supple.  Cardiovascular: Regular rhythm, S1 normal and S2 normal. Exam reveals no gallop and no friction rub.  No murmur heard. Pulmonary/Chest: Effort normal. No respiratory distress. He has wheezes. He exhibits no tenderness.  Abdominal: Soft. Normal appearance and bowel sounds are normal. There is no hepatosplenomegaly. There is no tenderness. There is no rebound, no guarding, no tenderness at McBurney's point and negative Murphy's sign. No hernia.  Musculoskeletal: Normal range of motion.  Neurological: He is alert and oriented to person, place, and time. He has normal strength. No cranial nerve deficit or sensory deficit. Coordination normal. GCS eye subscore is 4. GCS verbal subscore is 5. GCS motor subscore is 6.  Skin: Skin is warm, dry and intact. No rash noted. No cyanosis.  Psychiatric: He has a normal mood and affect. His speech is normal and behavior is normal. Thought content normal.  Nursing note and vitals reviewed.    ED Treatments / Results  Labs (all labs ordered are listed, but only abnormal results are displayed) Labs Reviewed - No data to display  EKG EKG Interpretation  Date/Time:  Friday April 11 2018 05:04:29 EDT Ventricular Rate:  75 PR Interval:    QRS Duration: 85 QT Interval:  392 QTC Calculation: 438 R Axis:   88 Text Interpretation:  Sinus arrhythmia ST elev, probable normal early repol pattern No significant change since last tracing Confirmed by Gilda Crease [phone]) on 04/11/2018 6:33:29 AM   Radiology Dg Chest 2 View  Result Date: 04/11/2018 CLINICAL DATA:  Initial evaluation for acute shortness of breath. EXAM: CHEST - 2 VIEW COMPARISON:  Prior radiograph from 10/28/2017. FINDINGS: The cardiac and mediastinal silhouettes are stable in size and contour, and remain within normal  limits. The lungs are normally inflated. No airspace consolidation, pleural effusion, or pulmonary edema is identified. There is no pneumothorax. No acute osseous abnormality identified. IMPRESSION: No radiographic evidence for active cardiopulmonary disease. Electronically Signed   By: Rise Mu M.D.   On: 04/11/2018 06:19    Procedures Procedures (including critical care time)  Medications Ordered in ED Medications  albuterol (PROVENTIL HFA;VENTOLIN HFA) 108 (90 Base) MCG/ACT inhaler 2 puff (has no administration in time range)  predniSONE (DELTASONE) tablet 60 mg (60 mg Oral Given 04/11/18 0603)  ipratropium-albuterol (DUONEB) 0.5-2.5 (3) MG/3ML nebulizer solution 3 mL (3 mLs Nebulization Given 04/11/18 0542)  albuterol (PROVENTIL) (2.5 MG/3ML) 0.083% nebulizer solution 2.5 mg (2.5 mg Nebulization Given 04/11/18 0542)     Initial Impression / Assessment and Plan / ED Course  I have reviewed the triage vital signs and the nursing notes.  Pertinent labs & imaging results that were available during my care of the patient were reviewed by me and considered in my medical decision making (see chart for details).     With history of asthma presents to the ER with mild bronchospasm.  He does not have any hypoxia, vitals are normal.  Wheezing has resolved and he feels much improved after nebulizer treatment.  He did not have access to bronchodilator at home.  Will discharge with course of prednisone and inhaler.  Final Clinical Impressions(s) / ED Diagnoses   Final diagnoses:  Mild intermittent asthma with exacerbation    ED Discharge Orders         Ordered    albuterol (PROVENTIL HFA;VENTOLIN HFA) 108 (90 Base) MCG/ACT inhaler  Every 4 hours PRN     04/11/18 0637    predniSONE (DELTASONE) 20 MG tablet  Daily with breakfast     04/11/18 0637           Gilda Crease, MD 04/11/18 [phone]

## 2018-04-11 NOTE — ED Triage Notes (Signed)
Pt states hes been short of breath since the weather change. Pt has hx of asthma. Pt also states hes been having a cough but unable to cough anything up. Pt also c/o tightness in his chest.

## 2019-08-19 ENCOUNTER — Other Ambulatory Visit: Payer: Self-pay

## 2019-08-19 ENCOUNTER — Ambulatory Visit: Payer: Self-pay | Attending: Internal Medicine

## 2019-08-19 DIAGNOSIS — Z20822 Contact with and (suspected) exposure to covid-19: Secondary | ICD-10-CM | POA: Insufficient documentation

## 2019-08-20 LAB — NOVEL CORONAVIRUS, NAA: SARS-CoV-2, NAA: NOT DETECTED

## 2019-10-29 ENCOUNTER — Ambulatory Visit: Payer: HRSA Program | Attending: Internal Medicine

## 2019-10-29 ENCOUNTER — Other Ambulatory Visit: Payer: Self-pay

## 2019-10-29 DIAGNOSIS — Z20822 Contact with and (suspected) exposure to covid-19: Secondary | ICD-10-CM | POA: Diagnosis not present

## 2019-10-30 LAB — NOVEL CORONAVIRUS, NAA: SARS-CoV-2, NAA: NOT DETECTED

## 2019-10-30 LAB — SARS-COV-2, NAA 2 DAY TAT

## 2019-12-21 ENCOUNTER — Other Ambulatory Visit: Payer: Self-pay

## 2019-12-21 ENCOUNTER — Encounter (HOSPITAL_COMMUNITY): Payer: Self-pay | Admitting: Emergency Medicine

## 2019-12-21 ENCOUNTER — Emergency Department (HOSPITAL_COMMUNITY)
Admission: EM | Admit: 2019-12-21 | Discharge: 2019-12-22 | Disposition: A | Discharge status: Home / Self Care | Payer: Self-pay | Attending: Emergency Medicine | Admitting: Emergency Medicine

## 2019-12-21 DIAGNOSIS — Z87891 Personal history of nicotine dependence: Secondary | ICD-10-CM | POA: Insufficient documentation

## 2019-12-21 DIAGNOSIS — S61012A Laceration without foreign body of left thumb without damage to nail, initial encounter: Secondary | ICD-10-CM | POA: Insufficient documentation

## 2019-12-21 DIAGNOSIS — Y9389 Activity, other specified: Secondary | ICD-10-CM | POA: Insufficient documentation

## 2019-12-21 DIAGNOSIS — Z79899 Other long term (current) drug therapy: Secondary | ICD-10-CM | POA: Insufficient documentation

## 2019-12-21 DIAGNOSIS — Y999 Unspecified external cause status: Secondary | ICD-10-CM | POA: Insufficient documentation

## 2019-12-21 DIAGNOSIS — J45909 Unspecified asthma, uncomplicated: Secondary | ICD-10-CM | POA: Insufficient documentation

## 2019-12-21 DIAGNOSIS — Y92009 Unspecified place in unspecified non-institutional (private) residence as the place of occurrence of the external cause: Secondary | ICD-10-CM | POA: Insufficient documentation

## 2019-12-21 DIAGNOSIS — S61412A Laceration without foreign body of left hand, initial encounter: Secondary | ICD-10-CM

## 2019-12-21 MED ORDER — POVIDONE-IODINE 10 % EX SOLN
CUTANEOUS | Status: DC | PRN
  Filled 2019-12-21: qty 15

## 2019-12-21 MED ORDER — LIDOCAINE-EPINEPHRINE (PF) 1 %-1:200000 IJ SOLN
20.0000 mL | Freq: Once | INTRAMUSCULAR | Status: AC
  Administered 2019-12-21: 20 mL
  Filled 2019-12-21: qty 30

## 2019-12-21 MED ORDER — TETANUS-DIPHTH-ACELL PERTUSSIS 5-2.5-18.5 LF-MCG/0.5 IM SUSP
0.5000 mL | Freq: Once | INTRAMUSCULAR | Status: AC
  Administered 2019-12-22: 0.5 mL via INTRAMUSCULAR
  Filled 2019-12-21: qty 0.5

## 2019-12-21 MED ORDER — TRANEXAMIC ACID FOR EPISTAXIS
500.0000 mg | Freq: Once | TOPICAL | Status: AC
  Administered 2019-12-22: 500 mg via TOPICAL
  Filled 2019-12-21: qty 10
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
## 2019-12-26 ENCOUNTER — Emergency Department (HOSPITAL_COMMUNITY): Payer: Self-pay

## 2019-12-26 ENCOUNTER — Emergency Department (HOSPITAL_COMMUNITY)
Admission: EM | Admit: 2019-12-26 | Discharge: 2019-12-26 | Disposition: A | Discharge status: Home / Self Care | Payer: Self-pay | Attending: Emergency Medicine | Admitting: Emergency Medicine

## 2019-12-26 ENCOUNTER — Encounter (HOSPITAL_COMMUNITY): Payer: Self-pay | Admitting: Emergency Medicine

## 2019-12-26 ENCOUNTER — Other Ambulatory Visit: Payer: Self-pay

## 2019-12-26 DIAGNOSIS — Y9389 Activity, other specified: Secondary | ICD-10-CM | POA: Insufficient documentation

## 2019-12-26 DIAGNOSIS — S161XXA Strain of muscle, fascia and tendon at neck level, initial encounter: Secondary | ICD-10-CM | POA: Insufficient documentation

## 2019-12-26 DIAGNOSIS — R040 Epistaxis: Secondary | ICD-10-CM | POA: Insufficient documentation

## 2019-12-26 DIAGNOSIS — Z87891 Personal history of nicotine dependence: Secondary | ICD-10-CM | POA: Insufficient documentation

## 2019-12-26 DIAGNOSIS — Y998 Other external cause status: Secondary | ICD-10-CM | POA: Insufficient documentation

## 2019-12-26 DIAGNOSIS — S0083XA Contusion of other part of head, initial encounter: Secondary | ICD-10-CM | POA: Insufficient documentation

## 2019-12-26 DIAGNOSIS — M791 Myalgia, unspecified site: Secondary | ICD-10-CM | POA: Insufficient documentation

## 2019-12-26 DIAGNOSIS — J45909 Unspecified asthma, uncomplicated: Secondary | ICD-10-CM | POA: Insufficient documentation

## 2019-12-26 DIAGNOSIS — Y9289 Other specified places as the place of occurrence of the external cause: Secondary | ICD-10-CM | POA: Insufficient documentation

## 2019-12-26 DIAGNOSIS — M542 Cervicalgia: Secondary | ICD-10-CM | POA: Insufficient documentation

## 2019-12-26 DIAGNOSIS — R220 Localized swelling, mass and lump, head: Secondary | ICD-10-CM | POA: Insufficient documentation

## 2019-12-26 MED ORDER — IBUPROFEN 600 MG PO TABS: 600.0000 mg | ORAL_TABLET | Freq: Four times a day (QID) | ORAL | 0 refills | Status: DC | PRN

## 2019-12-26 MED ORDER — IBUPROFEN 800 MG PO TABS
800.0000 mg | ORAL_TABLET | Freq: Once | ORAL | Status: AC
  Administered 2019-12-26: 800 mg via ORAL
  Filled 2019-12-26: qty 1

## 2019-12-26 MED ORDER — CYCLOBENZAPRINE HCL 5 MG PO TABS: 5.0000 mg | ORAL_TABLET | Freq: Three times a day (TID) | ORAL | 0 refills | Status: DC | PRN
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
## 2020-01-04 ENCOUNTER — Other Ambulatory Visit: Payer: Self-pay

## 2020-01-04 ENCOUNTER — Emergency Department (HOSPITAL_COMMUNITY): Admission: EM | Admit: 2020-01-04 | Discharge: 2020-01-04 | Discharge status: Against Medical Advice | Payer: Self-pay

## 2020-02-22 ENCOUNTER — Encounter (HOSPITAL_COMMUNITY): Payer: Self-pay | Admitting: *Deleted

## 2020-02-22 ENCOUNTER — Emergency Department (HOSPITAL_COMMUNITY)
Admission: EM | Admit: 2020-02-22 | Discharge: 2020-02-22 | Disposition: A | Discharge status: Home / Self Care | Payer: Self-pay | Attending: Emergency Medicine | Admitting: Emergency Medicine

## 2020-02-22 DIAGNOSIS — S161XXD Strain of muscle, fascia and tendon at neck level, subsequent encounter: Secondary | ICD-10-CM

## 2020-02-22 DIAGNOSIS — Y999 Unspecified external cause status: Secondary | ICD-10-CM | POA: Insufficient documentation

## 2020-02-22 DIAGNOSIS — S161XXA Strain of muscle, fascia and tendon at neck level, initial encounter: Secondary | ICD-10-CM | POA: Insufficient documentation

## 2020-02-22 DIAGNOSIS — M62830 Muscle spasm of back: Secondary | ICD-10-CM | POA: Insufficient documentation

## 2020-02-22 DIAGNOSIS — Y939 Activity, unspecified: Secondary | ICD-10-CM | POA: Insufficient documentation

## 2020-02-22 DIAGNOSIS — L02416 Cutaneous abscess of left lower limb: Secondary | ICD-10-CM | POA: Insufficient documentation

## 2020-02-22 DIAGNOSIS — Y929 Unspecified place or not applicable: Secondary | ICD-10-CM | POA: Insufficient documentation

## 2020-02-22 DIAGNOSIS — Z87891 Personal history of nicotine dependence: Secondary | ICD-10-CM | POA: Insufficient documentation

## 2020-02-22 DIAGNOSIS — L0291 Cutaneous abscess, unspecified: Secondary | ICD-10-CM

## 2020-02-22 DIAGNOSIS — X58XXXA Exposure to other specified factors, initial encounter: Secondary | ICD-10-CM | POA: Insufficient documentation

## 2020-02-22 MED ORDER — CYCLOBENZAPRINE HCL 5 MG PO TABS: 5.0000 mg | ORAL_TABLET | Freq: Two times a day (BID) | ORAL | 0 refills | Status: DC | PRN

## 2020-02-22 MED ORDER — LIDOCAINE HCL (PF) 2 % IJ SOLN: 5.0000 mL | Freq: Once | INTRAMUSCULAR | Status: AC

## 2020-02-22 MED ORDER — SULFAMETHOXAZOLE-TRIMETHOPRIM 800-160 MG PO TABS: 1.0000 | ORAL_TABLET | Freq: Two times a day (BID) | ORAL | 0 refills | Status: AC

## 2020-02-22 MED ORDER — LIDOCAINE HCL (PF) 2 % IJ SOLN
INTRAMUSCULAR | Status: AC
  Filled 2020-02-22: qty 10

## 2020-02-22 MED ORDER — POVIDONE-IODINE 10 % EX SOLN
CUTANEOUS | Status: DC | PRN
  Filled 2020-02-22: qty 30

## 2020-02-22 NOTE — Discharge Instructions (Signed)
Continue doing your shower soaks to this abscess site as discussed, twice daily continue keeping this cleaned out.  Cover this abscess site until it has completely healed over.  Take the entire course of the antibiotics prescribed as discussed, get a bar of Dial antibacterial soap to use in the shower.  You have been prescribed a small quantity of Flexeril to help you with your muscle soreness in your neck.  You may benefit by seeing a back specialist for this condition.  I have referred you to an orthopedist for this, you can call for an appointment if needed.

## 2020-02-22 NOTE — Clinical Social Work Note (Signed)
Transition of Care [facility]) - Emergency Department Mini Assessment  Patient Details  Name: Robert Benton MRN: [id number] Date of Birth: 10-Jan-1991  Transition of Care [facility]) CM/SW Contact:    Ewing Schlein, LCSW Phone Number: 02/22/2020, 12:22 PM  Clinical Narrative: Patient is a 29 year old male who presented to the ED for an abscess and neck pain. Per chart review, patient has a PCP, but no insurance. CSW called patient to discuss referral to financial counselor to determine if patient is eligible for Medicaid or another assistance program. Patient agreeable to referral. CSW made referral to financial counselor, Jerene Dilling. TOC signing off.  ED Mini Assessment: What brought you to the Emergency Department? : Abscess; neck pain Barriers to Discharge: ED Barriers Resolved Barrier interventions: Referral to financial counselor Means of departure: Car Interventions which prevented an admission or readmission: Other (must enter comment) (Referral to financial counselor)  Patient Contact and Communications Key Contact 1: Financial counselor Jerene Dilling) Contact Date: 02/22/20      Admission diagnosis:  Abscess; Neck Pain Patient Active Problem List   Diagnosis Date Noted  . Routine general medical examination at a health care facility 06/08/2011   PCP:  Frederica Kuster, MD Pharmacy:   [facility] [address], [place] - [address] [place] [address] Phone: [phone] Fax: [phone]  [facility] [phone] - [place], [place] - [address] [place] [address] Phone: [phone] Fax: [phone]
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
## 2020-02-22 NOTE — ED Notes (Signed)
Bandage applied to left lower leg, pt tolerated well,

## 2020-03-11 ENCOUNTER — Other Ambulatory Visit: Payer: Self-pay

## 2020-03-13 ENCOUNTER — Other Ambulatory Visit: Payer: Self-pay

## 2020-03-13 ENCOUNTER — Emergency Department (HOSPITAL_COMMUNITY): Payer: Self-pay

## 2020-03-13 ENCOUNTER — Encounter (HOSPITAL_COMMUNITY): Payer: Self-pay | Admitting: Emergency Medicine

## 2020-03-13 ENCOUNTER — Emergency Department (HOSPITAL_COMMUNITY)
Admission: EM | Admit: 2020-03-13 | Discharge: 2020-03-13 | Disposition: A | Discharge status: Home / Self Care | Payer: Self-pay | Attending: Emergency Medicine | Admitting: Emergency Medicine

## 2020-03-13 DIAGNOSIS — R102 Pelvic and perineal pain: Secondary | ICD-10-CM | POA: Insufficient documentation

## 2020-03-13 DIAGNOSIS — Y9389 Activity, other specified: Secondary | ICD-10-CM | POA: Insufficient documentation

## 2020-03-13 DIAGNOSIS — Z87891 Personal history of nicotine dependence: Secondary | ICD-10-CM | POA: Insufficient documentation

## 2020-03-13 DIAGNOSIS — Y999 Unspecified external cause status: Secondary | ICD-10-CM | POA: Insufficient documentation

## 2020-03-13 DIAGNOSIS — Y9289 Other specified places as the place of occurrence of the external cause: Secondary | ICD-10-CM | POA: Insufficient documentation

## 2020-03-13 DIAGNOSIS — R079 Chest pain, unspecified: Secondary | ICD-10-CM | POA: Insufficient documentation

## 2020-03-13 DIAGNOSIS — S0181XA Laceration without foreign body of other part of head, initial encounter: Secondary | ICD-10-CM | POA: Insufficient documentation

## 2020-03-13 DIAGNOSIS — J45909 Unspecified asthma, uncomplicated: Secondary | ICD-10-CM | POA: Insufficient documentation

## 2020-03-13 LAB — COMPREHENSIVE METABOLIC PANEL
ALT: 22 U/L (ref 0–44)
AST: 28 U/L (ref 15–41)
Albumin: 4.9 g/dL (ref 3.5–5.0)
Alkaline Phosphatase: 99 U/L (ref 38–126)
Anion gap: 12 (ref 5–15)
BUN: 13 mg/dL (ref 6–20)
CO2: 26 mmol/L (ref 22–32)
Calcium: 10.2 mg/dL (ref 8.9–10.3)
Chloride: 100 mmol/L (ref 98–111)
Creatinine, Ser: 1.11 mg/dL (ref 0.61–1.24)
GFR calc Af Amer: >60 mL/min (ref >60)
GFR calc non Af Amer: >60 mL/min (ref >60)
Glucose, Bld: 88 mg/dL (ref 70–99)
Potassium: 5 mmol/L (ref 3.5–5.1)
Sodium: 138 mmol/L (ref 135–145)
Total Bilirubin: 0.7 mg/dL (ref 0.3–1.2)
Total Protein: 8.8 g/dL — ABNORMAL HIGH (ref 6.5–8.1)

## 2020-03-13 LAB — I-STAT CHEM 8, ED
BUN: 19 mg/dL (ref 6–20)
Calcium, Ion: 1.1 mmol/L — ABNORMAL LOW (ref 1.15–1.40)
Chloride: 104 mmol/L (ref 98–111)
Creatinine, Ser: 1 mg/dL (ref 0.61–1.24)
Glucose, Bld: 87 mg/dL (ref 70–99)
HCT: 54 % — ABNORMAL HIGH (ref 39.0–52.0)
Hemoglobin: 18.4 g/dL — ABNORMAL HIGH (ref 13.0–17.0)
Potassium: 5.8 mmol/L — ABNORMAL HIGH (ref 3.5–5.1)
Sodium: 139 mmol/L (ref 135–145)
TCO2: 29 mmol/L (ref 22–32)

## 2020-03-13 LAB — CBC
HCT: 52.2 % — ABNORMAL HIGH (ref 39.0–52.0)
Hemoglobin: 17.4 g/dL — ABNORMAL HIGH (ref 13.0–17.0)
MCH: 27.5 pg (ref 26.0–34.0)
MCHC: 33.3 g/dL (ref 30.0–36.0)
MCV: 82.5 fL (ref 80.0–100.0)
Platelets: 257 10*3/uL (ref 150–400)
RBC: 6.33 MIL/uL — ABNORMAL HIGH (ref 4.22–5.81)
RDW: 12.9 % (ref 11.5–15.5)
WBC: 9.6 10*3/uL (ref 4.0–10.5)
nRBC: 0 % (ref 0.0–0.2)

## 2020-03-13 LAB — ETHANOL: Alcohol, Ethyl (B): <10 mg/dL (ref <10)

## 2020-03-13 LAB — SAMPLE TO BLOOD BANK

## 2020-03-13 LAB — RAPID URINE DRUG SCREEN, HOSP PERFORMED
Amphetamines: NOT DETECTED
Barbiturates: NOT DETECTED
Benzodiazepines: POSITIVE — AB
Cocaine: NOT DETECTED
Opiates: NOT DETECTED
Tetrahydrocannabinol: POSITIVE — AB

## 2020-03-13 LAB — URINALYSIS, ROUTINE W REFLEX MICROSCOPIC
Bilirubin Urine: NEGATIVE
Glucose, UA: NEGATIVE mg/dL
Hgb urine dipstick: NEGATIVE
Ketones, ur: 20 mg/dL — AB
Leukocytes,Ua: NEGATIVE
Nitrite: NEGATIVE
Protein, ur: NEGATIVE mg/dL
Specific Gravity, Urine: >1.046 — ABNORMAL HIGH (ref 1.005–1.030)
pH: 6 (ref 5.0–8.0)

## 2020-03-13 LAB — LACTIC ACID, PLASMA: Lactic Acid, Venous: 1.4 mmol/L (ref 0.5–1.9)

## 2020-03-13 MED ORDER — CHLORHEXIDINE GLUCONATE 0.12% ORAL RINSE (MEDLINE KIT): 15.0000 mL | Freq: Two times a day (BID) | OROMUCOSAL | 0 refills | Status: DC

## 2020-03-13 MED ORDER — TETANUS-DIPHTH-ACELL PERTUSSIS 5-2.5-18.5 LF-MCG/0.5 IM SUSP: 0.5000 mL | Freq: Once | INTRAMUSCULAR | Status: DC

## 2020-03-13 MED ORDER — IOHEXOL 300 MG/ML  SOLN
100.0000 mL | Freq: Once | INTRAMUSCULAR | Status: AC | PRN
  Administered 2020-03-13: 100 mL via INTRAVENOUS

## 2020-03-13 MED ORDER — LACTATED RINGERS IV BOLUS
1000.0000 mL | Freq: Once | INTRAVENOUS | Status: AC
  Administered 2020-03-13: 1000 mL via INTRAVENOUS

## 2020-03-13 MED ORDER — LIDOCAINE HCL (PF) 1 % IJ SOLN
5.0000 mL | Freq: Once | INTRAMUSCULAR | Status: AC
  Administered 2020-03-13: 5 mL
  Filled 2020-03-13: qty 5

## 2020-03-13 NOTE — ED Notes (Signed)
Per lab, PT needs to be recollected because it was clotted.
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
## 2020-03-13 NOTE — ED Notes (Signed)
Patient verbalizes understanding of discharge instructions. Opportunity for questioning and answers were provided. Armband removed by staff. Patient discharged from ED.  

## 2020-03-13 NOTE — ED Notes (Signed)
Pt ambulated, steady gait, just complained of soreness. Pt given sandwich bag and water.
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
## 2020-04-16 ENCOUNTER — Other Ambulatory Visit: Payer: Self-pay

## 2020-04-16 ENCOUNTER — Emergency Department (HOSPITAL_COMMUNITY)
Admission: EM | Admit: 2020-04-16 | Discharge: 2020-04-16 | Disposition: A | Discharge status: Home / Self Care | Payer: Self-pay | Attending: Emergency Medicine | Admitting: Emergency Medicine

## 2020-04-16 ENCOUNTER — Encounter (HOSPITAL_COMMUNITY): Payer: Self-pay | Admitting: Emergency Medicine

## 2020-04-16 DIAGNOSIS — J4521 Mild intermittent asthma with (acute) exacerbation: Secondary | ICD-10-CM | POA: Insufficient documentation

## 2020-04-16 DIAGNOSIS — Z7951 Long term (current) use of inhaled steroids: Secondary | ICD-10-CM | POA: Insufficient documentation

## 2020-04-16 DIAGNOSIS — Z87891 Personal history of nicotine dependence: Secondary | ICD-10-CM | POA: Insufficient documentation

## 2020-04-16 MED ORDER — ALBUTEROL SULFATE HFA 108 (90 BASE) MCG/ACT IN AERS: 1.0000 | INHALATION_SPRAY | Freq: Four times a day (QID) | RESPIRATORY_TRACT | 0 refills | Status: DC | PRN

## 2020-04-16 MED ORDER — AEROCHAMBER PLUS FLO-VU SMALL MISC: 1.0000 | Freq: Once | Status: DC

## 2020-04-16 MED ORDER — ALBUTEROL SULFATE HFA 108 (90 BASE) MCG/ACT IN AERS
2.0000 | INHALATION_SPRAY | Freq: Once | RESPIRATORY_TRACT | Status: AC
  Administered 2020-04-16: 2 via RESPIRATORY_TRACT
  Filled 2020-04-16: qty 6.7

## 2020-04-16 MED ORDER — PREDNISONE 20 MG PO TABS: 40.0000 mg | ORAL_TABLET | Freq: Every day | ORAL | 0 refills | Status: AC

## 2020-04-16 NOTE — ED Provider Notes (Signed)
Pt with increased wheezing since running out of his inhaler 3 days ago.  EMR reviewed showing multiple visits to the emergency department for nonasthma related conditions over the last several months, most recently seen for asthma in 2019.  On exam this patient has diffuse mild expiratory wheezing but speaks in full sentences, oxygen is 97% on room air, respirations are even and unlabored with no accessory muscle use.  He is not febrile or tachycardic.  He will be given an albuterol metered-dose inhaler with a spacer, prescription for prednisone, he is aware of the indications for follow-up, I do not think this is an underlying acute respiratory illness or infection and I do not think he needs any further work-up at this time, the patient is in total agreement and aware of the indications for return  Medical screening examination/treatment/procedure(s) were conducted as a shared visit with non-physician practitioner(s) and myself.  I personally evaluated the patient during the encounter.  Clinical Impression:   Final diagnoses:  Mild intermittent asthma with exacerbation         Eber Hong, MD 04/17/20 [phone]

## 2020-04-16 NOTE — ED Provider Notes (Addendum)
[facility] EMERGENCY DEPARTMENT Provider Note   CSN: [id number] Arrival date & time: 04/16/20  3212     History Chief Complaint  Patient presents with  . Asthma    Robert Benton is a 29 y.o. male with history of mild intermittent asthma who presents today concern for 3 days of chest tightness and shortness of breath.  He reports he has utilized his albuterol rescue inhaler every day this week 2-3 times daily, and has woken up each night this week with cough.  He states that his symptoms resolved completely after utilization of his albuterol inhaler.  Robert Benton ran out of his inhaler 3 days ago, and has had progressively worsening shortness of breath and chest tightness since that time. Patient states that he primarily has issues with his asthma at times of season change or extreme weather.  He feels his symptoms are consistent with prior asthma exacerbations in the past.  I have personally reviewed his medical records and found that he has been seen in the emergency department in the past for asthma exacerbation (most recently September 2019) as well as for multiple other visits not related to his asthma.  Patient also has history of bipolar 1 disorder, headaches, and depression.  This patient has been vaccinated against COVID-19.  He denies any fever, chills, night sweats, nausea, vomiting, diarrhea, or other infectious symptoms at home at this time.  HPI     Past Medical History:  Diagnosis Date  . Asthma   . Bipolar 1 disorder ([place])   . Chronic back pain   . Depression   . Headache(784.0)    Migranes  . Personality disorder [facility])     Patient Active Problem List   Diagnosis Date Noted  . Routine general medical examination at a health care facility 06/08/2011    History reviewed. No pertinent surgical history.     Family History  Problem Relation Age of Onset  . Hypertension Father   . Asthma Sister   . Stroke Paternal Grandfather        grandparents  . Heart  disease Other        grandparents  . Hypertension Other        grandparents and other relative  . Kidney disease Other        grandparents  . Diabetes Other        grandparents  . Asthma Other        grandparents  . Cancer Neg Hx     Social History   Tobacco Use  . Smoking status: Former Smoker    Types: Cigarettes    Quit date: 07/29/2014    Years since quitting: 5.7  . Smokeless tobacco: Never Used  Vaping Use  . Vaping Use: Never used  Substance Use Topics  . Alcohol use: No    Alcohol/week: 0.0 standard drinks  . Drug use: Yes    Types: Marijuana    Home Medications Prior to Admission medications   Medication Sig Start Date End Date Taking? Authorizing Provider  albuterol (VENTOLIN HFA) 108 (90 Base) MCG/ACT inhaler Inhale 1-2 puffs into the lungs every 6 (six) hours as needed for wheezing or shortness of breath. 04/16/20   Bemnet Trovato, Eugene Garnet R, PA-C  chlorhexidine gluconate, MEDLINE KIT, (PERIDEX) 0.12 % solution Use as directed 15 mLs in the mouth or throat 2 (two) times daily. 03/13/20   Frann Rider, MD  cyclobenzaprine (FLEXERIL) 5 MG tablet Take 1 tablet (5 mg total) by mouth 2 (two) times  daily as needed for muscle spasms. 02/22/20   Evalee Jefferson, PA-C  ibuprofen (ADVIL) 200 MG tablet Take 600 mg by mouth every 6 (six) hours as needed.    [provider]  predniSONE (DELTASONE) 20 MG tablet Take 2 tablets (40 mg total) by mouth daily for 5 days. 04/16/20 04/21/20  Desare Duddy, Gypsy Balsam, PA-C    Allergies    Patient has no known allergies.  Review of Systems   Review of Systems  Constitutional: Negative for chills, fatigue and fever.  HENT: Positive for congestion. Negative for rhinorrhea and sore throat.   Eyes: Negative.   Respiratory: Positive for chest tightness, shortness of breath and wheezing. Negative for cough.   Cardiovascular: Negative for chest pain and palpitations.  Gastrointestinal: Negative for abdominal pain and nausea.    Musculoskeletal: Negative for myalgias.  Neurological: Negative for syncope, light-headedness and headaches.    Physical Exam Updated Vital Signs BP (!) 128/92   Pulse 72   Temp 98.7 F (37.1 C) (Oral)   Resp 19   Ht _0  (1.778 m)   Wt 63.5 kg   SpO2 98%   BMI 20.09 kg/m   Physical Exam Vitals and nursing note reviewed.  HENT:     Head: Normocephalic and atraumatic.     Mouth/Throat:     Mouth: Mucous membranes are moist.     Pharynx: No oropharyngeal exudate or posterior oropharyngeal erythema.  Eyes:     General:        Right eye: No discharge.        Left eye: No discharge.     Conjunctiva/sclera: Conjunctivae normal.  Cardiovascular:     Rate and Rhythm: Normal rate and regular rhythm.     Pulses: Normal pulses.  Pulmonary:     Effort: Pulmonary effort is normal. No respiratory distress.     Breath sounds: Decreased air movement present. Wheezing present. No rales.  Chest:     Chest wall: No tenderness.  Abdominal:     General: Bowel sounds are normal. There is no distension.     Palpations: Abdomen is soft.     Tenderness: There is no abdominal tenderness.  Musculoskeletal:        General: No deformity.     Cervical back: Neck supple.  Skin:    General: Skin is warm and dry.     Capillary Refill: Capillary refill takes less than 2 seconds.  Neurological:     Mental Status: He is alert. Mental status is at baseline.  Psychiatric:        Mood and Affect: Mood normal.     ED Results / Procedures / Treatments   Labs (all labs ordered are listed, but only abnormal results are displayed) Labs Reviewed - No data to display  EKG EKG Interpretation  Date/Time:  Saturday April 16 2020 07:49:12 EDT Ventricular Rate:  72 PR Interval:    QRS Duration: 87 QT Interval:  388 QTC Calculation: 425 R Axis:   92 Text Interpretation: Age not entered, assumed to be  29 years old for purpose of ECG interpretation Sinus arrhythmia Borderline right axis  deviation ST elev, probable normal early repol pattern since last tracing no significant change Confirmed by Noemi Chapel [phone]) on 04/16/2020 8:13:57 AM   Radiology No results found.  Procedures Procedures (including critical care time)  Medications Ordered in ED Medications  AeroChamber Plus Flo-Vu Small device MISC 1 each (has no administration in time range)  albuterol (VENTOLIN HFA) 108 (90  Base) MCG/ACT inhaler 2 puff (2 puffs Inhalation Given 04/16/20 8242)    ED Course  I have reviewed the triage vital signs and the nursing notes.  Pertinent labs & imaging results that were available during my care of the patient were reviewed by me and considered in my medical decision making (see chart for details).    MDM Rules/Calculators/A&P                         Robert Benton is a 29 year old male with history of mild intermittent asthma presenting today for acute exacerbation of his asthma.  Reports 3 days of shortness of breath and chest tightness, after running out of his own rescue inhaler at home earlier this week.  On intake patient's vital signs are reassuring, HR 79, SPO2 97% on room air, blood pressure 113/89, mildly tachypneic to 26. EKG without significant change from prior.   On physical exam the patient's respirations are unlabored without retractions or accessory muscle use; he is not tachycardic. He does have diffuse expiratory wheezing, however, appears comfortable in the bed at this time and is able to speak in full sentences with me.  He will be administered albuterol MDI with spacer and will be provided with prescription for refill of his albuterol inhaler outpatient.  Additionally he was prescribed 5-day course of prednisone.   Differential diagnosis for this patient requires consideration of infectious etiology, however based on history and physical exam, patient presentation is more consistent with an acute asthma exacerbation.  Additionally, patient has been vaccinated  against COVID-19, and denies recent exposures.  I do not feel any further work-up in the emergency department is required at this time. The patient is stable for discharge.   Recommend follow up with primary care provider for reevaluation after completion of steroid course.  Strict return precautions were given.  Mr. Wery voiced understanding of the treatment plan, and each of his questions were answered to his expressed satisfaction.   Robert Benton was evaluated in Emergency Department on 04/16/2020 for the symptoms described in the history of present illness. He was evaluated in the context of the global COVID-19 pandemic, which necessitated consideration that the patient might be at risk for infection with the SARS-CoV-2 virus that causes COVID-19. Institutional protocols and algorithms that pertain to the evaluation of patients at risk for COVID-19 are in a state of rapid change based on information released by regulatory bodies including the CDC and federal and state organizations. These policies and algorithms were followed during the patient's care in the ED.    Final Clinical Impression(s) / ED Diagnoses Final diagnoses:  Mild intermittent asthma with exacerbation    Rx / DC Orders ED Discharge Orders         Ordered    predniSONE (DELTASONE) 20 MG tablet  Daily        04/16/20 0802    albuterol (VENTOLIN HFA) 108 (90 Base) MCG/ACT inhaler  Every 6 hours PRN        04/16/20 0802           Umer Harig, Gypsy Balsam, PA-C 04/16/20 0838    Mizani Dilday, Gypsy Balsam, PA-C 04/16/20 3536    Noemi Chapel, MD 04/17/20 [phone]

## 2020-04-16 NOTE — ED Triage Notes (Signed)
Pt c/o of his asthma flaring up the last 3 days. States "I woke up in the middle of the night and couldn't breathe" pt has rescue inhaler but used it all the last 2 days

## 2020-04-16 NOTE — Discharge Instructions (Addendum)
You were seen today in the emergency department for an exacerbation of your asthma.  You were prescribed a steroid medication called prednisone; you should pick this up from your pharmacy today and complete all 5 days of the medication as prescribed.  Additionally, you were prescribed a refill of your albuterol inhaler. This medication can vary in price depending on the pharmacy; you may use GoodRx.com to find a coupon for the cheapest price. You may present that coupon to your pharmacy for a discount.   Please follow up with a primary care provider in the next week for reevaluation of your lungs.  Please return to the emergency department if you develop any increased difficulty of breathing, chest pain, palpitations, or new or worsening symptoms.

## 2020-11-01 ENCOUNTER — Encounter (HOSPITAL_COMMUNITY): Payer: Self-pay | Admitting: Emergency Medicine

## 2020-11-01 ENCOUNTER — Emergency Department (HOSPITAL_COMMUNITY)
Admission: EM | Admit: 2020-11-01 | Discharge: 2020-11-02 | Disposition: A | Discharge status: Home / Self Care | Payer: Self-pay | Attending: Emergency Medicine | Admitting: Emergency Medicine

## 2020-11-01 ENCOUNTER — Other Ambulatory Visit: Payer: Self-pay

## 2020-11-01 DIAGNOSIS — J45909 Unspecified asthma, uncomplicated: Secondary | ICD-10-CM | POA: Insufficient documentation

## 2020-11-01 DIAGNOSIS — T1491XA Suicide attempt, initial encounter: Secondary | ICD-10-CM

## 2020-11-01 DIAGNOSIS — F3132 Bipolar disorder, current episode depressed, moderate: Secondary | ICD-10-CM | POA: Insufficient documentation

## 2020-11-01 DIAGNOSIS — Z9151 Personal history of suicidal behavior: Secondary | ICD-10-CM | POA: Insufficient documentation

## 2020-11-01 DIAGNOSIS — R45851 Suicidal ideations: Secondary | ICD-10-CM | POA: Insufficient documentation

## 2020-11-01 DIAGNOSIS — Z20822 Contact with and (suspected) exposure to covid-19: Secondary | ICD-10-CM | POA: Insufficient documentation

## 2020-11-01 DIAGNOSIS — Z87891 Personal history of nicotine dependence: Secondary | ICD-10-CM | POA: Insufficient documentation

## 2020-11-01 DIAGNOSIS — Z9114 Patient's other noncompliance with medication regimen: Secondary | ICD-10-CM | POA: Insufficient documentation

## 2020-11-01 LAB — RAPID URINE DRUG SCREEN, HOSP PERFORMED
Amphetamines: NOT DETECTED
Barbiturates: NOT DETECTED
Benzodiazepines: NOT DETECTED
Cocaine: NOT DETECTED
Opiates: NOT DETECTED
Tetrahydrocannabinol: POSITIVE — AB

## 2020-11-01 LAB — CBC WITH DIFFERENTIAL/PLATELET
Abs Immature Granulocytes: 0.07 10*3/uL (ref 0.00–0.07)
Basophils Absolute: 0.1 10*3/uL (ref 0.0–0.1)
Basophils Relative: 0 %
Eosinophils Absolute: 0.9 10*3/uL — ABNORMAL HIGH (ref 0.0–0.5)
Eosinophils Relative: 5 %
HCT: 44.3 % (ref 39.0–52.0)
Hemoglobin: 15.6 g/dL (ref 13.0–17.0)
Immature Granulocytes: 0 %
Lymphocytes Relative: 18 %
Lymphs Abs: 3 10*3/uL (ref 0.7–4.0)
MCH: 29.1 pg (ref 26.0–34.0)
MCHC: 35.2 g/dL (ref 30.0–36.0)
MCV: 82.6 fL (ref 80.0–100.0)
Monocytes Absolute: 1.3 10*3/uL — ABNORMAL HIGH (ref 0.1–1.0)
Monocytes Relative: 8 %
Neutro Abs: 11.3 10*3/uL — ABNORMAL HIGH (ref 1.7–7.7)
Neutrophils Relative %: 69 %
Platelets: 236 10*3/uL (ref 150–400)
RBC: 5.36 MIL/uL (ref 4.22–5.81)
RDW: 12.7 % (ref 11.5–15.5)
WBC: 16.6 10*3/uL — ABNORMAL HIGH (ref 4.0–10.5)
nRBC: 0 % (ref 0.0–0.2)

## 2020-11-01 LAB — COMPREHENSIVE METABOLIC PANEL
ALT: 38 U/L (ref 0–44)
AST: 25 U/L (ref 15–41)
Albumin: 4.4 g/dL (ref 3.5–5.0)
Alkaline Phosphatase: 80 U/L (ref 38–126)
Anion gap: 9 (ref 5–15)
BUN: 12 mg/dL (ref 6–20)
CO2: 24 mmol/L (ref 22–32)
Calcium: 9 mg/dL (ref 8.9–10.3)
Chloride: 103 mmol/L (ref 98–111)
Creatinine, Ser: 0.83 mg/dL (ref 0.61–1.24)
GFR, Estimated: >60 mL/min (ref >60)
Glucose, Bld: 90 mg/dL (ref 70–99)
Potassium: 4 mmol/L (ref 3.5–5.1)
Sodium: 136 mmol/L (ref 135–145)
Total Bilirubin: 0.9 mg/dL (ref 0.3–1.2)
Total Protein: 7.6 g/dL (ref 6.5–8.1)

## 2020-11-01 LAB — ETHANOL: Alcohol, Ethyl (B): <10 mg/dL (ref <10)

## 2020-11-01 LAB — RESP PANEL BY RT-PCR (FLU A&B, COVID) ARPGX2
Influenza A by PCR: NEGATIVE
Influenza B by PCR: NEGATIVE
SARS Coronavirus 2 by RT PCR: NEGATIVE

## 2020-11-01 LAB — SALICYLATE LEVEL: Salicylate Lvl: <7 mg/dL — ABNORMAL LOW (ref 7.0–30.0)

## 2020-11-01 LAB — ACETAMINOPHEN LEVEL: Acetaminophen (Tylenol), Serum: <10 ug/mL — ABNORMAL LOW (ref 10–30)

## 2020-11-01 NOTE — ED Notes (Signed)
Faxed IVC paperwork to [facility] @ [phone]

## 2020-11-01 NOTE — ED Triage Notes (Signed)
Pt is under ivc, locked himself in the bathroom and was gone his wrist.  Pt is supposed to be taking trileptal but states it is too strong and he does not like the way it makes him feel, has been flushing meds down the toilet.

## 2020-11-01 NOTE — ED Provider Notes (Signed)
[facility] EMERGENCY DEPARTMENT Provider Note   CSN: [id number] Arrival date & time: 11/01/20  1442     History No chief complaint on file.   Robert Benton is a 30 y.o. male with a history including bipolar disorder, depression, personality disorder who has been noncompliant with his Trileptal as he feels it is too strong and he does not like how it makes him feel.  He has not taken this medication in the past 3 days, this is prescribed by [facility].  He endorses increased depression and suicidal ideation.  Prior to arrival he locked himself in the bathroom with a knife with the intent of cutting his wrists, his sister intervened and he did not injure himself.  He presents with [employer].  He endorses feeling worthless, reports prior history of suicide attempt, stating he tried to shoot himself years ago and was treated at a mental health facility in [place] for this incident.  Has no physical complaints at this time.  HPI     Past Medical History:  Diagnosis Date  . Asthma   . Bipolar 1 disorder ([place])   . Chronic back pain   . Depression   . Headache(784.0)    Migranes  . Personality disorder [facility])     Patient Active Problem List   Diagnosis Date Noted  . Routine general medical examination at a health care facility 06/08/2011    History reviewed. No pertinent surgical history.     Family History  Problem Relation Age of Onset  . Hypertension Father   . Asthma Sister   . Stroke Paternal Grandfather        grandparents  . Heart disease Other        grandparents  . Hypertension Other        grandparents and other relative  . Kidney disease Other        grandparents  . Diabetes Other        grandparents  . Asthma Other        grandparents  . Cancer Neg Hx     Social History   Tobacco Use  . Smoking status: Former Smoker    Types: Cigarettes    Quit date: 07/29/2014    Years since quitting: 6.2  . Smokeless tobacco: Never Used  Vaping  Use  . Vaping Use: Never used  Substance Use Topics  . Alcohol use: No    Alcohol/week: 0.0 standard drinks  . Drug use: Yes    Types: Marijuana    Home Medications Prior to Admission medications   Medication Sig Start Date End Date Taking? Authorizing Provider  albuterol (VENTOLIN HFA) 108 (90 Base) MCG/ACT inhaler Inhale 1-2 puffs into the lungs every 6 (six) hours as needed for wheezing or shortness of breath. 04/16/20  Yes Sponseller, Rebekah R, PA-C  OXcarbazepine (TRILEPTAL PO) Take 1 tablet by mouth daily.   Yes [provider]  chlorhexidine gluconate, MEDLINE KIT, (PERIDEX) 0.12 % solution Use as directed 15 mLs in the mouth or throat 2 (two) times daily. Patient not taking: Reported on 11/01/2020 03/13/20   Frann Rider, MD  cyclobenzaprine (FLEXERIL) 5 MG tablet Take 1 tablet (5 mg total) by mouth 2 (two) times daily as needed for muscle spasms. Patient not taking: Reported on 11/01/2020 02/22/20   Evalee Jefferson, PA-C  ibuprofen (ADVIL) 200 MG tablet Take 600 mg by mouth every 6 (six) hours as needed. Patient not taking: Reported on 11/01/2020    [provider]  Allergies    Patient has no known allergies.  Review of Systems   Review of Systems  Constitutional: Positive for appetite change.       Reports poor appetite  HENT: Negative.   Eyes: Negative.   Respiratory: Negative for chest tightness and shortness of breath.   Cardiovascular: Negative for chest pain.  Gastrointestinal: Negative for abdominal pain, nausea and vomiting.  Genitourinary: Negative.   Musculoskeletal: Negative for arthralgias, joint swelling and neck pain.  Skin: Negative.  Negative for rash and wound.  Neurological: Negative for dizziness, weakness, light-headedness, numbness and headaches.  Psychiatric/Behavioral: Positive for dysphoric mood, self-injury and suicidal ideas.    Physical Exam Updated Vital Signs BP (!) 146/92 (BP Location: Right Arm)   Pulse 62   Temp 97.8  F (36.6 C) (Oral)   Resp 18   SpO2 100%   Physical Exam Vitals and nursing note reviewed.  Constitutional:      Appearance: He is well-developed.  HENT:     Head: Normocephalic and atraumatic.  Eyes:     Conjunctiva/sclera: Conjunctivae normal.  Cardiovascular:     Rate and Rhythm: Normal rate and regular rhythm.     Heart sounds: Normal heart sounds.  Pulmonary:     Effort: Pulmonary effort is normal.     Breath sounds: Normal breath sounds. No wheezing.  Abdominal:     General: Bowel sounds are normal.     Palpations: Abdomen is soft.     Tenderness: There is no abdominal tenderness.  Musculoskeletal:        General: Normal range of motion.     Cervical back: Normal range of motion.  Skin:    General: Skin is warm and dry.  Neurological:     General: No focal deficit present.     Mental Status: He is alert.  Psychiatric:        Attention and Perception: He does not perceive auditory or visual hallucinations.        Mood and Affect: Mood is depressed. Affect is flat.        Speech: Speech normal.        Behavior: Behavior normal. Behavior is cooperative.        Thought Content: Thought content includes suicidal ideation. Thought content includes suicidal plan.        Judgment: Judgment is impulsive.     ED Results / Procedures / Treatments   Labs (all labs ordered are listed, but only abnormal results are displayed) Labs Reviewed  RAPID URINE DRUG SCREEN, HOSP PERFORMED - Abnormal; Notable for the following components:      Result Value   Tetrahydrocannabinol POSITIVE (*)    All other components within normal limits  CBC WITH DIFFERENTIAL/PLATELET - Abnormal; Notable for the following components:   WBC 16.6 (*)    Neutro Abs 11.3 (*)    Monocytes Absolute 1.3 (*)    Eosinophils Absolute 0.9 (*)    All other components within normal limits  SALICYLATE LEVEL - Abnormal; Notable for the following components:   Salicylate Lvl <3.8 (*)    All other components  within normal limits  ACETAMINOPHEN LEVEL - Abnormal; Notable for the following components:   Acetaminophen (Tylenol), Serum <10 (*)    All other components within normal limits  RESP PANEL BY RT-PCR (FLU A&B, COVID) ARPGX2  COMPREHENSIVE METABOLIC PANEL  ETHANOL    EKG None  Radiology No results found.  Procedures Procedures   Medications Ordered in ED Medications - No data  to display  ED Course  I have reviewed the triage vital signs and the nursing notes.  Pertinent labs & imaging results that were available during my care of the patient were reviewed by me and considered in my medical decision making (see chart for details).  Clinical Course as of 11/01/20 1724  Tue Nov 01, 2020  1635 30 yo male here for suicide attempt, locked himself in bathroom at home and threatened to cut his wrists.  Patient under IVC on arrival, refilled by myself.  Vitals stable.  [MT]    Clinical Course User Index [MT] Wyvonnia Dusky, MD   MDM Rules/Calculators/A&P                          Patient with depression and suicidal ideation, attempt to cut his wrists prior to arrival, no injury on exam.  He has been medically cleared at this time.  Planning TTS evaluation for probable placement.  IVC papers were completed upon arrival here. Final Clinical Impression(s) / ED Diagnoses Final diagnoses:  Suicidal behavior with attempted self-injury [facility])    Rx / DC Orders ED Discharge Orders    None       Landis Martins 11/01/20 1724    Wyvonnia Dusky, MD 11/02/20 [phone]

## 2020-11-01 NOTE — ED Notes (Signed)
Robert Benton, TTS, called and is recommending inpatient placement. She is looking for placement at [facility]
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
## 2020-11-01 NOTE — ED Notes (Signed)
Pt dressed out and belongings bagged.

## 2020-11-02 ENCOUNTER — Inpatient Hospital Stay (HOSPITAL_COMMUNITY)
Admission: AD | Admit: 2020-11-02 | Discharge: 2020-11-08 | DRG: 885 | Disposition: A | Discharge status: Home / Self Care | Payer: Federal, State, Local not specified - Other | Source: Intra-hospital | Attending: Emergency Medicine | Admitting: Emergency Medicine

## 2020-11-02 DIAGNOSIS — R45851 Suicidal ideations: Secondary | ICD-10-CM | POA: Diagnosis present

## 2020-11-02 DIAGNOSIS — Z9151 Personal history of suicidal behavior: Secondary | ICD-10-CM | POA: Diagnosis not present

## 2020-11-02 DIAGNOSIS — Z8249 Family history of ischemic heart disease and other diseases of the circulatory system: Secondary | ICD-10-CM | POA: Diagnosis not present

## 2020-11-02 DIAGNOSIS — Z818 Family history of other mental and behavioral disorders: Secondary | ICD-10-CM

## 2020-11-02 DIAGNOSIS — F332 Major depressive disorder, recurrent severe without psychotic features: Secondary | ICD-10-CM | POA: Diagnosis not present

## 2020-11-02 DIAGNOSIS — F339 Major depressive disorder, recurrent, unspecified: Secondary | ICD-10-CM | POA: Diagnosis present

## 2020-11-02 NOTE — Progress Notes (Signed)
Pt was accepted to [facility] 402-2  Meets inpatient criteria per Dr. Bronwen Betters   The attending physician is Dr. Mason Jim  Report can be called to [phone]   Patient is IVC.  Patient can be transported via [facility]  Patient is scheduled to arrive at [facility] at anytime, RN Maud Deed made aware.

## 2020-11-02 NOTE — ED Notes (Signed)
Attempted report 

## 2020-11-02 NOTE — Tx Team (Signed)
Initial Treatment Plan 11/02/2020 7:15 PM Robert Benton YOK:[id number]    PATIENT STRESSORS: Health problems Medication change or noncompliance   PATIENT STRENGTHS: Ability for insight Active sense of humor Capable of independent living Communication skills Financial means Physical Health   PATIENT IDENTIFIED PROBLEMS: anxiety  depression  SI  Medication compliance-can't afford meds               DISCHARGE CRITERIA:  Ability to meet basic life and health needs Adequate post-discharge living arrangements Improved stabilization in mood, thinking, and/or behavior Medical problems require only outpatient monitoring Reduction of life-threatening or endangering symptoms to within safe limits Safe-care adequate arrangements madean  PRELIMINARY DISCHARGE PLAN: Outpatient therapy Return to previous work or school arrangements  PATIENT/FAMILY INVOLVEMENT: This treatment plan has been presented to and reviewed with the patient, Robert Benton .The patient has been given the opportunity to ask questions and make suggestions.  Wardell Heath, RN 11/02/2020, 7:15 PM

## 2020-11-02 NOTE — ED Notes (Signed)
Belongings sent with [facility] for transport to [facility] 402-2

## 2020-11-02 NOTE — Progress Notes (Signed)
   11/02/20 1707  Vital Signs  Temp 98.2 F (36.8 C)  Temp Source Oral  Pulse Rate 64  Pulse Rate Source Dinamap  Resp 16  BP 139/87  BP Location Left Arm  BP Method Automatic  Patient Position (if appropriate) Sitting  Oxygen Therapy  SpO2 100 %  Pain Assessment  Pain Scale 0-10  Pain Score 0  Complaints & Interventions  Complains of Anxiety;Restless  Height and Weight  Height 5\' 10"  (1.778 m)  Weight 60.8 kg  Type of Scale Used Standing  Type of Weight Actual  BSA (Calculated - sq m) 1.73 sq meters  BMI (Calculated) 19.23  Weight in (lb) to have BMI = 25 173.9   Initial Nursing Assessment  D: Patient is a 30 y.o. AA male admitted IVC from [facility] for SUA by cutting wrist. Pt. Stated that he felt like his medication was "too much" so he flushed them down the toilet. Police were called and he was taken to the ED. No cuts were made on pt's wrist. PT. Has a hx of Bipolar, chronic back pain, asthma, MDD, GAD. Pt. Currently denies SI/HI/AVH.  A: Support and encouragement provided Routine safety checks conducted every 15 minutes. Patient  Informed to notify staff with any concerns.  R: Pt. Contracts for safety. Safety maintained.

## 2020-11-02 NOTE — Progress Notes (Signed)
Patient under review at [facility].  Penni Homans, MSW, LCSW 11/02/2020 9:52 AM

## 2020-11-02 NOTE — ED Notes (Signed)
Pt speaking with his father on the phone at this time

## 2020-11-02 NOTE — ED Provider Notes (Signed)
Emergency Medicine Observation Re-evaluation Note  Robert Benton is a 30 y.o. male, seen on rounds today.  Pt initially presented to the ED for complaints of depression and SI. Currently, the patient is resting comfortably.  Physical Exam  BP 107/76 (BP Location: Right Arm)   Pulse (!) 59   Temp 98.1 F (36.7 C) (Oral)   Resp 17   Ht 1.778 m (5\' 10" )   Wt 63.5 kg   SpO2 97%   BMI 20.09 kg/m  Physical Exam General: resting Cardiac: regular rate Lungs: breathing comfortably Psych: resting  ED Course / MDM  EKG:   I have reviewed the labs performed to date as well as medications administered while in observation.  Recent changes in the last 24 hours include stabilization on meds, BH reassessment.   Plan  Current plan is for [facility] team is recommending inpatient psychiatric care and is working on placement - ?beds at [facility].  The patient has been placed in psychiatric observation due to the need to provide a safe environment for the patient while obtaining psychiatric consultation and evaluation, as well as ongoing medical and medication management to treat the patient's condition.  The patient has been placed under full IVC at this time.    [facility], MD 11/02/20 [phone]

## 2020-11-03 ENCOUNTER — Encounter (HOSPITAL_COMMUNITY): Payer: Self-pay | Admitting: Emergency Medicine

## 2020-11-03 ENCOUNTER — Other Ambulatory Visit: Payer: Self-pay

## 2020-11-03 DIAGNOSIS — F32A Depression, unspecified: Secondary | ICD-10-CM | POA: Insufficient documentation

## 2020-11-03 DIAGNOSIS — F39 Unspecified mood [affective] disorder: Secondary | ICD-10-CM | POA: Insufficient documentation

## 2020-11-03 DIAGNOSIS — F339 Major depressive disorder, recurrent, unspecified: Secondary | ICD-10-CM

## 2020-11-03 HISTORY — DX: Major depressive disorder, recurrent, unspecified: F33.9

## 2020-11-03 MED ORDER — TRAZODONE HCL 50 MG PO TABS
50.0000 mg | ORAL_TABLET | Freq: Every evening | ORAL | Status: DC | PRN
  Administered 2020-11-03 – 2020-11-07 (×5): 50 mg via ORAL
  Filled 2020-11-03: qty 7
  Filled 2020-11-03 (×5): qty 1

## 2020-11-03 MED ORDER — LORAZEPAM 1 MG PO TABS: 1.0000 mg | ORAL_TABLET | Freq: Four times a day (QID) | ORAL | Status: DC | PRN

## 2020-11-03 MED ORDER — ALUM & MAG HYDROXIDE-SIMETH 200-200-20 MG/5ML PO SUSP: 30.0000 mL | ORAL | Status: DC | PRN

## 2020-11-03 MED ORDER — ACETAMINOPHEN 325 MG PO TABS
650.0000 mg | ORAL_TABLET | Freq: Four times a day (QID) | ORAL | Status: DC | PRN
  Administered 2020-11-03 – 2020-11-06 (×3): 650 mg via ORAL
  Filled 2020-11-03 (×4): qty 2

## 2020-11-03 MED ORDER — HYDROXYZINE HCL 25 MG PO TABS
25.0000 mg | ORAL_TABLET | Freq: Three times a day (TID) | ORAL | Status: DC | PRN
  Administered 2020-11-03 – 2020-11-05 (×6): 25 mg via ORAL
  Filled 2020-11-03 (×6): qty 1

## 2020-11-03 MED ORDER — OXCARBAZEPINE 150 MG PO TABS
150.0000 mg | ORAL_TABLET | Freq: Two times a day (BID) | ORAL | Status: DC
  Administered 2020-11-03 – 2020-11-08 (×10): 150 mg via ORAL
  Filled 2020-11-03 (×3): qty 1
  Filled 2020-11-03: qty 14
  Filled 2020-11-03: qty 1
  Filled 2020-11-03: qty 14
  Filled 2020-11-03: qty 1
  Filled 2020-11-03: qty 14
  Filled 2020-11-03 (×2): qty 1
  Filled 2020-11-03: qty 14
  Filled 2020-11-03 (×4): qty 1

## 2020-11-03 MED ORDER — LORAZEPAM 2 MG/ML IJ SOLN: 2.0000 mg | Freq: Four times a day (QID) | INTRAMUSCULAR | Status: DC | PRN

## 2020-11-03 MED ORDER — MAGNESIUM HYDROXIDE 400 MG/5ML PO SUSP: 30.0000 mL | Freq: Every day | ORAL | Status: DC | PRN

## 2020-11-03 MED ORDER — ALBUTEROL SULFATE HFA 108 (90 BASE) MCG/ACT IN AERS: 2.0000 | INHALATION_SPRAY | Freq: Four times a day (QID) | RESPIRATORY_TRACT | Status: DC | PRN

## 2020-11-03 MED ORDER — OLANZAPINE 5 MG PO TBDP: 5.0000 mg | ORAL_TABLET | Freq: Four times a day (QID) | ORAL | Status: DC | PRN
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
## 2020-11-03 NOTE — [facility] Suicide Risk Assessment (Signed)
[facility] Admission Suicide Risk Assessment   Nursing information obtained from:  Patient Demographic factors:  Male,Adolescent or young adult,Low socioeconomic status Current Mental Status:  NA Loss Factors:  Financial problems / change in socioeconomic status Historical Factors:  Family history of mental illness or substance abuse,Impulsivity Risk Reduction Factors:  Sense of responsibility to family,Responsible for children under 30 years of age,Employed,Living with another person, especially a relative,Positive social support  Total Time spent with patient: 30 minutes Principal Problem: Recurrent major depressive disorder (HCC) Diagnosis:  Principal Problem:   Recurrent major depressive disorder (HCC)  Subjective Data: See H&P  Continued Clinical Symptoms:    The "Alcohol Use Disorders Identification Test", Guidelines for Use in Primary Care, Second Edition.  World Science writer [facility]). Score between 0-7:  no or low risk or alcohol related problems. Score between 8-15:  moderate risk of alcohol related problems. Score between 16-19:  high risk of alcohol related problems. Score 20 or above:  warrants further diagnostic evaluation for alcohol dependence and treatment.   CLINICAL FACTORS:   Depression:   Hopelessness Impulsivity Alcohol/Substance Abuse/Dependencies More than one psychiatric diagnosis Unstable or Poor Therapeutic Relationship Previous Psychiatric Diagnoses and Treatments   Musculoskeletal: Strength & Muscle Tone: within normal limits Gait & Station: normal Patient leans: N/A  Psychiatric Specialty Exam:  Presentation  General Appearance: Appropriate for Environment; Casual  Eye Contact:Good  Speech:Clear and Coherent; Normal Rate  Speech Volume:Normal  Handedness:No data recorded  Mood and Affect  Mood:Depressed; Irritable  Affect:Constricted   Thought Process  Thought Processes:Coherent; Goal Directed; Linear  Descriptions of  Associations:Intact  Orientation:Full (Time, Place and Person)  Thought Content:Logical  History of Schizophrenia/Schizoaffective disorder:No  Duration of Psychotic Symptoms:No data recorded Hallucinations:Hallucinations: None  Ideas of Reference:None  Suicidal Thoughts:Suicidal Thoughts: Yes, Active SI Active Intent and/or Plan: With Intent; With Plan (Had intent and plan prior to admission.  Denies current intent or plan to harm self in hospital.)  Homicidal Thoughts:Homicidal Thoughts: No   Sensorium  Memory:Immediate Good; Recent Good; Remote Good  Judgment:Fair  Insight:Fair   Executive Functions  Concentration:Good  Attention Span:Good  Recall:Good  Fund of Knowledge:Good  Language:Good   Psychomotor Activity  Psychomotor Activity:Psychomotor Activity: Normal   Assets  Assets:Communication Skills; Desire for Improvement; Housing; Vocational/Educational; Resilience; Social Support   Sleep  Sleep:Sleep: Good    Physical Exam: Physical Exam Vitals and nursing note reviewed.  Constitutional:      Appearance: Normal appearance.  HENT:     Head: Normocephalic and atraumatic.  Neurological:     General: No focal deficit present.     Mental Status: He is alert and oriented to person, place, and time.    ROS  Constitutional: Negative for chills, diaphoresis and fever.  HENT: Negative for hearing loss and sore throat.   Eyes: Negative for blurred vision.  Respiratory: Negative for cough and shortness of breath.   Cardiovascular: Negative for chest pain and palpitations.  Gastrointestinal: Negative for constipation, diarrhea, nausea and vomiting.  Genitourinary: Negative for dysuria.  Musculoskeletal: Negative for joint pain and myalgias.  Skin: Negative for rash.  Neurological: Negative for dizziness, tremors and headaches.  Psychiatric/Behavioral: Positive for depression and suicidal ideas. Negative for hallucinations. The patient is not  nervous/anxious.    Blood pressure (!) 127/101, pulse 90, temperature 97.8 F (36.6 C), temperature source Oral, resp. rate 16, height 5\' 10"  (1.778 m), weight 60.8 kg, SpO2 98 %. Body mass index is 19.23 kg/m.   COGNITIVE FEATURES THAT CONTRIBUTE TO RISK:  None    SUICIDE RISK:   Moderate:  Frequent suicidal ideation with limited intensity, and duration, some specificity in terms of plans, no associated intent, good self-control, limited dysphoria/symptomatology, some risk factors present, and identifiable protective factors, including available and accessible social support.  PLAN OF CARE: See H&P  I certify that inpatient services furnished can reasonably be expected to improve the patient's condition.   Claudie Revering, MD 11/03/2020, 1:47 PM

## 2020-11-03 NOTE — [facility] Counselor (Signed)
CSW provided the patient with information about free clinics and medication assistance in [place] and information about how to apply for disability.  CSW also provided patient with [facility] cards and suicide prevention information.
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
## 2020-11-03 NOTE — Progress Notes (Signed)
   11/03/20 0615  Vital Signs  Pulse Rate 90  BP (!) 127/101  BP Location Left Arm  BP Method Automatic  Patient Position (if appropriate) Standing    D: Patient denies SI/HI/AVH. Patient complained of some anxiety and back pain. Patient was out in open areas and was social with peers. A:  Patient took scheduled medicine. Pt's anxiety was relieved by 25 mg of Vistaril and back pain was relieved by  650 mg of Tylenol. Support and encouragement provided Routine safety checks conducted every 15 minutes. Patient  Informed to notify staff with any concerns.   R:  Safety maintained.

## 2020-11-03 NOTE — Progress Notes (Signed)
Adult Psychoeducational Group Note  Date:  4/14/20221 Time:  3:25 PM  Group Topic/Focus:  Goals Group:   The focus of this group is to help patients establish daily goals to achieve during treatment and discuss how the patient can incorporate goal setting into their daily lives to aide in recovery.  Participation Level:  Active  Participation Quality:  Appropriate  Affect:  Appropriate  Cognitive:  Alert  Insight: Appropriate  Engagement in Group:  Engaged  Modes of Intervention:  Discussion  Additional Comments:  Pt attended group and participated in discussion.  [name] R [name] 11/03/2020, 3:25 PM

## 2020-11-03 NOTE — Progress Notes (Signed)
[facility] Group Notes:  (Nursing/MHT/Case Management/Adjunct)  Date:  11/03/2020  Time:  2015  Type of Therapy:  wrap up group  Participation Level:  Active  Participation Quality:  Appropriate, Attentive, Sharing and Supportive  Affect:  Depressed and Flat  Cognitive:  Alert  Insight:  Improving  Engagement in Group:  Engaged  Modes of Intervention:  Clarification, Education and Support  Summary of Progress/Problems: Positive thinking and positive change were discussed.   Marcille Buffy 11/03/2020, 9:49 PM

## 2020-11-03 NOTE — Progress Notes (Signed)
   11/03/20 2300  Psych Admission Type (Psych Patients Only)  Admission Status Involuntary  Psychosocial Assessment  Patient Complaints Anxiety;Sleep disturbance  Eye Contact Brief  Facial Expression Anxious  Affect Anxious  Speech Logical/coherent  Interaction Assertive  Motor Activity Fidgety  Appearance/Hygiene Unremarkable  Behavior Characteristics Cooperative  Mood Depressed  Thought Process  Coherency WDL  Content WDL  Delusions WDL;None reported or observed  Perception WDL  Hallucination None reported or observed  Judgment WDL  Confusion WDL  Danger to Self  Current suicidal ideation? Denies  Danger to Others  Danger to Others None reported or observed  Patient c/o sleep disturbances trazodone prn given at HS and effective. Support and encouragement provided as needed. Q 15 minutes safety checks ongoing Patient remain safe.

## 2020-11-03 NOTE — Progress Notes (Signed)
   11/03/20 0237  Psych Admission Type (Psych Patients Only)  Admission Status Involuntary  Psychosocial Assessment  Patient Complaints Anxiety  Eye Contact Brief  Facial Expression Anxious  Affect Anxious  Speech Logical/coherent  Interaction Assertive  Motor Activity Fidgety  Appearance/Hygiene Unremarkable  Behavior Characteristics Cooperative  Mood Pleasant  Thought Process  Coherency WDL  Content WDL  Delusions WDL;None reported or observed  Perception WDL  Hallucination None reported or observed  Judgment WDL  Confusion WDL  Danger to Self  Current suicidal ideation? Denies  Danger to Others  Danger to Others None reported or observed

## 2020-11-04 LAB — CBC WITH DIFFERENTIAL/PLATELET
Abs Immature Granulocytes: 0.03 10*3/uL (ref 0.00–0.07)
Basophils Absolute: 0.1 10*3/uL (ref 0.0–0.1)
Basophils Relative: 1 %
Eosinophils Absolute: 0.8 10*3/uL — ABNORMAL HIGH (ref 0.0–0.5)
Eosinophils Relative: 7 %
HCT: 44.2 % (ref 39.0–52.0)
Hemoglobin: 15.5 g/dL (ref 13.0–17.0)
Immature Granulocytes: 0 %
Lymphocytes Relative: 32 %
Lymphs Abs: 3.8 10*3/uL (ref 0.7–4.0)
MCH: 28.9 pg (ref 26.0–34.0)
MCHC: 35.1 g/dL (ref 30.0–36.0)
MCV: 82.5 fL (ref 80.0–100.0)
Monocytes Absolute: 1.1 10*3/uL — ABNORMAL HIGH (ref 0.1–1.0)
Monocytes Relative: 9 %
Neutro Abs: 6.2 10*3/uL (ref 1.7–7.7)
Neutrophils Relative %: 51 %
Platelets: 247 10*3/uL (ref 150–400)
RBC: 5.36 MIL/uL (ref 4.22–5.81)
RDW: 12.4 % (ref 11.5–15.5)
WBC: 12 10*3/uL — ABNORMAL HIGH (ref 4.0–10.5)
nRBC: 0 % (ref 0.0–0.2)

## 2020-11-04 LAB — LIPID PANEL
Cholesterol: 178 mg/dL (ref 0–200)
HDL: 65 mg/dL (ref >40)
LDL Cholesterol: 105 mg/dL — ABNORMAL HIGH (ref 0–99)
Total CHOL/HDL Ratio: 2.7 RATIO
Triglycerides: 38 mg/dL (ref <150)
VLDL: 8 mg/dL (ref 0–40)

## 2020-11-04 LAB — TSH: TSH: 1.325 u[IU]/mL (ref 0.350–4.500)

## 2020-11-04 MED ORDER — NICOTINE POLACRILEX 2 MG MT GUM
2.0000 mg | CHEWING_GUM | OROMUCOSAL | Status: DC | PRN
  Administered 2020-11-06 – 2020-11-07 (×3): 2 mg via ORAL
  Filled 2020-11-04: qty 1

## 2020-11-04 NOTE — Progress Notes (Signed)
Recreation Therapy Notes  Date: 4.15.22 Time: 0930 Location: [address] Dayroom  Group Topic: Stress Management   Goal Area(s) Addresses:  Patient will actively participate in stress management techniques presented during session.  Patient will successfully identify benefit of practicing stress management post d/c.   Behavioral Response: Appropriate  Intervention: Guided exercise with ambient sound and script  Activity :Guided Imagery  LRT read a script that focused on sitting peaceful at the beach and listening to the waves as they moved in and out at the shore. Patients were to listen and follow along as script was read to fully engage in activity.  Education:  Stress Management, Discharge Planning.   Education Outcome: Acknowledges education  Clinical Observations/Feedback: Patient actively engaged in technique introduced, expressed no concerns.    Shaquel Chavous, LRT/CTRS         Alira Fretwell A 11/04/2020 10:38 AM 

## 2020-11-04 NOTE — Progress Notes (Signed)
PRN given Tylenol given 6/10 for c/o back pain. Staff will continue to monitor for changes in condition or effectiveness.

## 2020-11-04 NOTE — Progress Notes (Signed)
PRN Vistaril given for c/o anxiety after a phone call. Patient was advised to use his coping mechanisms and to advise staff if feelings of anxiety increases or improves.

## 2020-11-04 NOTE — Progress Notes (Signed)
Patient ID: Robert Benton, male   DOB: 03-20-1991, 30 y.o.   MRN: [id number]   D- Patient alert and oriented. Patient affect/mood appears good. Denies SI, HI, AVH, and pain. Patient stated " I am about to pop off" because of not being able to speak to his daughter over the phone.   A- Scheduled medications and PRN administered to patient, per MD orders. Support and encouragement provided.  Routine safety checks conducted every 15 minutes.  Patient informed to notify staff with problems or concerns.  R- No adverse drug reactions noted. Patient contracts for safety at this time. Patient compliant with medications and treatment plan. Patient receptive, calm, and cooperative. Patient was observed talking on the phone and appears relaxed. Patient interacts well with others on the unit.  Patient remains safe at this time. Staff will continue to monitor for changes in condition/ behavior.            [place] NOVEL CORONAVIRUS (COVID-19) DAILY CHECK-OFF SYMPTOMS - answer yes or no to each - every day NO YES  Have you had a fever in the past 24 hours?   Fever (Temp > 37.80C / 100F) X    Have you had any of these symptoms in the past 24 hours?  New Cough   Sore Throat    Shortness of Breath   Difficulty Breathing   Unexplained Body Aches   X    Have you had any one of these symptoms in the past 24 hours not related to allergies?    Runny Nose   Nasal Congestion   Sneezing   X    If you have had runny nose, nasal congestion, sneezing in the past 24 hours, has it worsened?   X    EXPOSURES - check yes or no X    Have you traveled outside the state in the past 14 days?   X    Have you been in contact with someone with a confirmed diagnosis of COVID-19 or PUI in the past 14 days without wearing appropriate PPE?   X    Have you been living in the same home as a person with confirmed diagnosis of COVID-19 or a PUI (household contact)?     X    Have you been diagnosed with COVID-19?      X                                                                                                                             What to do next: Answered NO to all: Answered YES to anything:    Proceed with unit schedule Follow the [facility] Inpatient Flowsheet.
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
## 2020-11-04 NOTE — Tx Team (Signed)
Interdisciplinary Treatment and Diagnostic Plan Update  11/04/2020 Time of Session: 3668 Robert Benton MRN: [id number]  Principal Diagnosis: Recurrent major depressive disorder [facility])  Secondary Diagnoses: Principal Problem:   Recurrent major depressive disorder (Robert Benton)   Current Medications:  Current Facility-Administered Medications  Medication Dose Route Frequency Provider Last Rate Last Admin  . acetaminophen (TYLENOL) tablet 650 mg  650 mg Oral Q6H PRN Ival Bible, MD   650 mg at 11/03/20 1031  . albuterol (VENTOLIN HFA) 108 (90 Base) MCG/ACT inhaler 2 puff  2 puff Inhalation Q6H PRN Arthor Captain, MD      . alum & mag hydroxide-simeth (MAALOX/MYLANTA) 200-200-20 MG/5ML suspension 30 mL  30 mL Oral Q4H PRN Ival Bible, MD      . hydrOXYzine (ATARAX/VISTARIL) tablet 25 mg  25 mg Oral TID PRN Ival Bible, MD   25 mg at 11/04/20 1449  . LORazepam (ATIVAN) tablet 1 mg  1 mg Oral Q6H PRN Arthor Captain, MD       Or  . LORazepam (ATIVAN) injection 2 mg  2 mg Intramuscular Q6H PRN Arthor Captain, MD      . magnesium hydroxide (MILK OF MAGNESIA) suspension 30 mL  30 mL Oral Daily PRN Ival Bible, MD      . OLANZapine zydis (ZYPREXA) disintegrating tablet 5 mg  5 mg Oral Q6H PRN Arthor Captain, MD      . OXcarbazepine (TRILEPTAL) tablet 150 mg  150 mg Oral BID Arthor Captain, MD   150 mg at 11/04/20 0811  . traZODone (DESYREL) tablet 50 mg  50 mg Oral QHS PRN Ival Bible, MD   50 mg at 11/03/20 2104   PTA Medications: Medications Prior to Admission  Medication Sig Dispense Refill Last Dose  . albuterol (VENTOLIN HFA) 108 (90 Base) MCG/ACT inhaler Inhale 1-2 puffs into the lungs every 6 (six) hours as needed for wheezing or shortness of breath. 1 each 0   . chlorhexidine gluconate, MEDLINE KIT, (PERIDEX) 0.12 % solution Use as directed 15 mLs in the mouth or throat 2 (two) times daily. (Patient not taking: Reported on 11/01/2020) 120 mL 0    . cyclobenzaprine (FLEXERIL) 5 MG tablet Take 1 tablet (5 mg total) by mouth 2 (two) times daily as needed for muscle spasms. (Patient not taking: Reported on 11/01/2020) 20 tablet 0   . ibuprofen (ADVIL) 200 MG tablet Take 600 mg by mouth every 6 (six) hours as needed. (Patient not taking: Reported on 11/01/2020)     . OXcarbazepine (TRILEPTAL PO) Take 1 tablet by mouth daily.       Patient Stressors: Health problems Medication change or noncompliance  Patient Strengths: Ability for insight Active sense of humor Capable of independent living Child psychotherapist Physical Health  Treatment Modalities: Medication Management, Group therapy, Case management,  1 to 1 session with clinician, Psychoeducation, Recreational therapy.   Physician Treatment Plan for Primary Diagnosis: Recurrent major depressive disorder ([place]) Long Term Goal(s): Improvement in symptoms so as ready for discharge   Short Term Goals: Ability to identify changes in lifestyle to reduce recurrence of condition will improve Ability to verbalize feelings will improve Ability to disclose and discuss suicidal ideas Ability to demonstrate self-control will improve Ability to identify and develop effective coping behaviors will improve Compliance with prescribed medications will improve Ability to identify triggers associated with substance abuse/mental health issues will improve  Medication Management: Evaluate patient's response, side effects, and tolerance  of medication regimen.  Therapeutic Interventions: 1 to 1 sessions, Unit Group sessions and Medication administration.  Evaluation of Outcomes: Progressing  Physician Treatment Plan for Secondary Diagnosis: Principal Problem:   Recurrent major depressive disorder (Robert Benton)  Long Term Goal(s): Improvement in symptoms so as ready for discharge   Short Term Goals: Ability to identify changes in lifestyle to reduce recurrence of condition will  improve Ability to verbalize feelings will improve Ability to disclose and discuss suicidal ideas Ability to demonstrate self-control will improve Ability to identify and develop effective coping behaviors will improve Compliance with prescribed medications will improve Ability to identify triggers associated with substance abuse/mental health issues will improve     Medication Management: Evaluate patient's response, side effects, and tolerance of medication regimen.  Therapeutic Interventions: 1 to 1 sessions, Unit Group sessions and Medication administration.  Evaluation of Outcomes: Progressing   RN Treatment Plan for Primary Diagnosis: Recurrent major depressive disorder (Robert Benton) Long Term Goal(s): Knowledge of disease and therapeutic regimen to maintain health will improve  Short Term Goals: Ability to remain free from injury will improve, Ability to disclose and discuss suicidal ideas, Ability to identify and develop effective coping behaviors will improve and Compliance with prescribed medications will improve  Medication Management: RN will administer medications as ordered by provider, will assess and evaluate patient's response and provide education to patient for prescribed medication. RN will report any adverse and/or side effects to prescribing provider.  Therapeutic Interventions: 1 on 1 counseling sessions, Psychoeducation, Medication administration, Evaluate responses to treatment, Monitor vital signs and CBGs as ordered, Perform/monitor CIWA, COWS, AIMS and Fall Risk screenings as ordered, Perform wound care treatments as ordered.  Evaluation of Outcomes: Progressing   LCSW Treatment Plan for Primary Diagnosis: Recurrent major depressive disorder (Robert Benton) Long Term Goal(s): Safe transition to appropriate next level of care at discharge, Engage patient in therapeutic group addressing interpersonal concerns.  Short Term Goals: Engage patient in aftercare planning with  referrals and resources, Increase ability to appropriately verbalize feelings, Increase emotional regulation, Identify triggers associated with mental health/substance abuse issues and Increase skills for wellness and recovery  Therapeutic Interventions: Assess for all discharge needs, 1 to 1 time with Social worker, Explore available resources and support systems, Assess for adequacy in community support network, Educate family and significant other(s) on suicide prevention, Complete Psychosocial Assessment, Interpersonal group therapy.  Evaluation of Outcomes: Progressing   Progress in Treatment: Attending groups: Yes. Participating in groups: Yes. Taking medication as prescribed: Yes. Toleration medication: Yes. Family/Significant other contact made: No, will contact:  partner. Patient understands diagnosis: Yes. Discussing patient identified problems/goals with staff: Yes. Medical problems stabilized or resolved: Yes. Denies suicidal/homicidal ideation: Yes. Issues/concerns per patient self-inventory: No. Other: N/A  New problem(s) identified: No, Describe:  none noted.  New Short Term/Long Term Goal(s): Safe transition to appropriate next level of care at discharge, Engage patient in therapeutic group addressing interpersonal concerns.  Patient Goals:  "Get put back on my medication, work on my anxiety and depression, I get mad a lot"  Discharge Plan or Barriers: Pt to follow up with recommended level of care and medication management services.  Reason for Continuation of Hospitalization: Anxiety Depression Medication stabilization Suicidal ideation  Estimated Length of Stay: 3-5 days  Attendees: Patient: Robert Benton 11/04/2020 3:38 PM  Physician: Dr. Jeneen Rinks, MD 11/04/2020 3:38 PM  Nursing:  11/04/2020 3:38 PM  RN Care Manager: 11/04/2020 3:38 PM  Social Worker: Sherren Mocha, LCSW 11/04/2020 3:38 PM  Recreational Therapist:  11/04/2020 3:38 PM  Other:  11/04/2020 3:38 PM   Other:  11/04/2020 3:38 PM  Other: 11/04/2020 3:38 PM    Scribe for Treatment Team: Blane Ohara, LCSW 11/04/2020 3:38 PM

## 2020-11-05 DIAGNOSIS — F332 Major depressive disorder, recurrent severe without psychotic features: Secondary | ICD-10-CM

## 2020-11-05 MED ORDER — FLUOXETINE HCL 10 MG PO CAPS
10.0000 mg | ORAL_CAPSULE | Freq: Every day | ORAL | Status: DC
  Administered 2020-11-05 – 2020-11-08 (×4): 10 mg via ORAL
  Filled 2020-11-05 (×3): qty 1
  Filled 2020-11-05: qty 7
  Filled 2020-11-05 (×2): qty 1
  Filled 2020-11-05: qty 7

## 2020-11-05 MED ORDER — HYDROXYZINE HCL 50 MG PO TABS
50.0000 mg | ORAL_TABLET | Freq: Three times a day (TID) | ORAL | Status: DC | PRN
  Administered 2020-11-05 – 2020-11-07 (×6): 50 mg via ORAL
  Filled 2020-11-05 (×7): qty 1

## 2020-11-05 NOTE — Progress Notes (Signed)
[facility]  11/05/2020 9:03 PM Robert PierceKesean J Peltz  MRN:  [id number]   Chief complaint: depression  Subjective:  Robert Benton is a 30 y.o. male with a history of depression vs bipolar d/o, alcohol use d/o, personality d/o and anxiety, who was initially admitted for inpatient psychiatric hospitalization on 11/02/2020 for management of SI. The patient is currently on Hospital Day 3.   Chart Review from last 24 hours:  The patient's chart was reviewed and nursing notes were reviewed. The patient's case was discussed in multidisciplinary team meeting. Per nursing, the patient had no safety concerns or behavioral issues. Per MAR the patient was compliant with scheduled medications and received Vistaril X3 yesterday for anxiety and Trazodone X1 for sleep.   Information Obtained Today During Patient Interview: The patient was seen and evaluated on the unit. On assessment today the patient reports that he is feeling better today and has tolerated the start of Prozac. He denies medication side-effects. He denies SI, HI, AVH, or paranoia. He states his sleep and appetite have been good and he voices no physical complaints. He reports that he has been attending groups and hopes to be out in time next week to start orientation for a new job.   Principal Problem: Recurrent major depressive disorder (HCC) Diagnosis: Principal Problem:   Recurrent major depressive disorder (HCC)  Total Time Spent in Direct Patient Care:  I personally spent 30 minutes on the unit in direct patient care. The direct patient care time included face-to-face time with the patient, reviewing the patient's chart, communicating with other professionals, and coordinating care. Greater than 50% of this time was spent in counseling or coordinating care with the patient regarding goals of hospitalization, psycho-education, and discharge planning needs.  Past Psychiatric History: see admission H&P  Past Medical History:  Past  Medical History:  Diagnosis Date  . Asthma   . Bipolar 1 disorder (HCC)   . Chronic back pain   . Depression   . Headache(784.0)    Migranes  . Personality disorder (HCC)    Family History:  Family History  Problem Relation Age of Onset  . Hypertension Father   . Asthma Sister   . Stroke Paternal Grandfather        grandparents  . Heart disease Other        grandparents  . Hypertension Other        grandparents and other relative  . Kidney disease Other        grandparents  . Diabetes Other        grandparents  . Asthma Other        grandparents  . Cancer Neg Hx    Family Psychiatric  History: see admission H&P  Social History:  Social History   Substance and Sexual Activity  Alcohol Use No  . Alcohol/week: 0.0 standard drinks     Social History   Substance and Sexual Activity  Drug Use Yes  . Types: Marijuana    Social History   Socioeconomic History  . Marital status: Single    Spouse name: Not on file  . Number of children: Not on file  . Years of education: Not on file  . Highest education level: Not on file  Occupational History  . Not on file  Tobacco Use  . Smoking status: Former Smoker    Types: Cigarettes    Quit date: 07/29/2014    Years since quitting: 6.2  . Smokeless tobacco: Never Used  Vaping Use  . Vaping Use: Never used  Substance and Sexual Activity  . Alcohol use: No    Alcohol/week: 0.0 standard drinks  . Drug use: Yes    Types: Marijuana  . Sexual activity: Yes  Other Topics Concern  . Not on file  Social History Narrative   Alcoholic beverage: No      Drug use: No      Seatbead Use: Yes      Firearms in home:      Exercise: 3-4 times a week,  Cardio      Smoke Alarm in your home:  yes                  Social Determinants of Health   Financial Resource Strain: Not on file  Food Insecurity: Not on file  Transportation Needs: Not on file  Physical Activity: Not on file  Stress: Not on file  Social  Connections: Not on file   Sleep: Good  Appetite:  Good  Current Medications: Current Facility-Administered Medications  Medication Dose Route Frequency Provider Last Rate Last Admin  . acetaminophen (TYLENOL) tablet 650 mg  650 mg Oral Q6H PRN Estella Husk, MD   650 mg at 11/04/20 1813  . albuterol (VENTOLIN HFA) 108 (90 Base) MCG/ACT inhaler 2 puff  2 puff Inhalation Q6H PRN Claudie Revering, MD      . alum & mag hydroxide-simeth (MAALOX/MYLANTA) 200-200-20 MG/5ML suspension 30 mL  30 mL Oral Q4H PRN Estella Husk, MD      . FLUoxetine (PROZAC) capsule 10 mg  10 mg Oral Daily Laveda Abbe, NP   10 mg at 11/05/20 0940  . hydrOXYzine (ATARAX/VISTARIL) tablet 50 mg  50 mg Oral TID PRN Laveda Abbe, NP      . LORazepam (ATIVAN) tablet 1 mg  1 mg Oral Q6H PRN Claudie Revering, MD       Or  . LORazepam (ATIVAN) injection 2 mg  2 mg Intramuscular Q6H PRN Claudie Revering, MD      . magnesium hydroxide (MILK OF MAGNESIA) suspension 30 mL  30 mL Oral Daily PRN Estella Husk, MD      . nicotine polacrilex (NICORETTE) gum 2 mg  2 mg Oral PRN Claudie Revering, MD      . OLANZapine zydis (ZYPREXA) disintegrating tablet 5 mg  5 mg Oral Q6H PRN Claudie Revering, MD      . OXcarbazepine (TRILEPTAL) tablet 150 mg  150 mg Oral BID Claudie Revering, MD   150 mg at 11/05/20 1754  . traZODone (DESYREL) tablet 50 mg  50 mg Oral QHS PRN Estella Husk, MD   50 mg at 11/04/20 2114    Lab Results:  Results for orders placed or performed during the hospital encounter of 11/02/20 (from the past 48 hour(s))  CBC with Differential/Platelet     Status: Abnormal   Collection Time: 11/04/20  6:29 PM  Result Value Ref Range   WBC 12.0 (H) 4.0 - 10.5 K/uL   RBC 5.36 4.22 - 5.81 MIL/uL   Hemoglobin 15.5 13.0 - 17.0 g/dL   HCT 41.6 60.6 - 30.1 %   MCV 82.5 80.0 - 100.0 fL   MCH 28.9 26.0 - 34.0 pg   MCHC 35.1 30.0 - 36.0 g/dL   RDW 60.1 09.3 - 23.5 %   Platelets 247 150 -  400 K/uL   nRBC 0.0 0.0 - 0.2 %   Neutrophils  Relative % 51 %   Neutro Abs 6.2 1.7 - 7.7 K/uL   Lymphocytes Relative 32 %   Lymphs Abs 3.8 0.7 - 4.0 K/uL   Monocytes Relative 9 %   Monocytes Absolute 1.1 (H) 0.1 - 1.0 K/uL   Eosinophils Relative 7 %   Eosinophils Absolute 0.8 (H) 0.0 - 0.5 K/uL   Basophils Relative 1 %   Basophils Absolute 0.1 0.0 - 0.1 K/uL   Immature Granulocytes 0 %   Abs Immature Granulocytes 0.03 0.00 - 0.07 K/uL    Comment: Performed at [facility], [address], [place], [place] [address]  TSH     Status: None   Collection Time: 11/04/20  6:29 PM  Result Value Ref Range   TSH 1.325 0.350 - 4.500 uIU/mL    Comment: Performed by a 3rd Generation assay with a functional sensitivity of <=0.01 uIU/mL. Performed at [facility], [address], [place], [place] [address]   Lipid panel     Status: Abnormal   Collection Time: 11/04/20  6:29 PM  Result Value Ref Range   Cholesterol 178 0 - 200 mg/dL   Triglycerides 38 <297 mg/dL   HDL 65 >98 mg/dL   Total CHOL/HDL Ratio 2.7 RATIO   VLDL 8 0 - 40 mg/dL   LDL Cholesterol 921 (H) 0 - 99 mg/dL    Comment:        Total Cholesterol/HDL:CHD Risk Coronary Heart Disease Risk Table                     Men   Women  1/2 Average Risk   3.4   3.3  Average Risk       5.0   4.4  2 X Average Risk   9.6   7.1  3 X Average Risk  23.4   11.0        Use the calculated Patient Ratio above and the CHD Risk Table to determine the patient's CHD Risk.        ATP III CLASSIFICATION (LDL):  <100     mg/dL   Optimal  194-174  mg/dL   Near or Above                    Optimal  130-159  mg/dL   Borderline  081-448  mg/dL   High  >185     mg/dL   Very High Performed at [facility], [address], [place], [place] [address]     Blood Alcohol level:  Lab Results  Component Value Date   ETH <10 11/01/2020   ETH <10 03/13/2020    Metabolic Disorder Labs: No results  found for: HGBA1C, MPG No results found for: PROLACTIN Lab Results  Component Value Date   CHOL 178 11/04/2020   TRIG 38 11/04/2020   HDL 65 11/04/2020   CHOLHDL 2.7 11/04/2020   VLDL 8 11/04/2020   LDLCALC 105 (H) 11/04/2020   LDLCALC 100 (H) 06/08/2011    Physical Findings: AIMS: Facial and Oral Movements Muscles of Facial Expression: None, normal Lips and Perioral Area: None, normal Jaw: None, normal Tongue: None, normal,Extremity Movements Upper (arms, wrists, hands, fingers): None, normal Lower (legs, knees, ankles, toes): None, normal, Trunk Movements Neck, shoulders, hips: None, normal, Overall Severity Severity of abnormal movements (highest score from questions above): None, normal Incapacitation due to abnormal movements: None, normal Patient's awareness of abnormal movements (rate only patient's report): No Awareness,  Dental Status Current problems with teeth and/or dentures?: No Does patient usually wear dentures?: No      Musculoskeletal: Strength & Muscle Tone: within normal limits Gait & Station: normal, steady Patient leans: N/A  Psychiatric Specialty Exam: Physical Exam Vitals reviewed.  HENT:     Head: Normocephalic.  Pulmonary:     Effort: Pulmonary effort is normal.  Neurological:     General: No focal deficit present.     Mental Status: He is alert.     Review of Systems  Respiratory: Negative for shortness of breath.   Cardiovascular: Negative for chest pain.  Gastrointestinal: Negative for nausea and vomiting.    Blood pressure 118/89, pulse 69, temperature 97.8 F (36.6 C), temperature source Oral, resp. rate 18, height 5\' 10"  (1.778 m), weight 60.8 kg, SpO2 100 %.Body mass index is 19.23 kg/m.  General Appearance: casually dressed, adequate hygiene  Eye Contact:  Good  Speech:  Clear and Coherent and Normal Rate  Volume:  Normal  Mood:  described as improving - appears dysphoric  Affect:  Constricted  Thought Process:  Goal Directed  and Linear  Orientation:  Full (Time, Place, and Person)  Thought Content:  Logical and denies AVH or paranoia; no acute psychosis on exam, no delusions  Suicidal Thoughts:  No  Homicidal Thoughts:  No  Memory:  Immediate;   Good  Judgement:  Fair  Insight:  Fair  Psychomotor Activity:  Normal  Concentration:  Concentration: Good and Attention Span: Good  Recall:  Good  Fund of Knowledge:  Good  Language:  Good  Akathisia:  Negative  Assets:  Communication Skills Desire for Improvement Resilience  ADL's:  Intact  Cognition:  WNL  Sleep:  Number of Hours: 6.75   Treatment Plan Summary: Treatment Plan Summary: Patient is a 30 year old male with a history of prior episodes of depression and alcohol use disorder as well as prior diagnoses of bipolar disorder, personality disorder and anxiety whoisadmitted with increased depressive symptoms for at least 2 weeks prior to admission in the context of multiple psychosocial stressors and culminating in a near suicide attempt that precipitated the current admission.     Daily contact with patient to assess and evaluate symptoms and progress in treatment and Medication management   Mood  -Continue Trileptal 150 mg twice daily for mood stabilization - Prozac 10mg  daily  Anxiety -Continue hydroxyzine 25 mg 3 times daily as needed anxiety  Insomnia -Continue trazodone 50 mg nightly as needed insomnia  Labs: Repeat CBC shows WBC trending down to 12.0 from 16.6 with H/H 15.5/44.2 and platelets 247; TSH 1.325; cholesterol 178, triglycerides 38, HDL 65 and LDL 105; A1c pending  Social work is working on .  I certify that inpatient services furnished can reasonably be expected to improve the patient's condition.  10-10-1995, MD, FAPA 11/05/2020, 9:03 PM

## 2020-11-05 NOTE — Progress Notes (Incomplete)
Writer spoke with patient 1:1 and he reports having had a good day. He reports that he has a job starting next week and is hope

## 2020-11-05 NOTE — Progress Notes (Signed)
   11/05/20 2100  COVID-19 Daily Checkoff  Have you had a fever (temp > 37.80C/100F)  in the past 24 hours?  No  If you have had runny nose, nasal congestion, sneezing in the past 24 hours, has it worsened? No  COVID-19 EXPOSURE  Have you traveled outside the state in the past 14 days? No  Have you been in contact with someone with a confirmed diagnosis of COVID-19 or PUI in the past 14 days without wearing appropriate PPE? No  Have you been living in the same home as a person with confirmed diagnosis of COVID-19 or a PUI (household contact)? No  Have you been diagnosed with COVID-19? No

## 2020-11-05 NOTE — Progress Notes (Signed)
Pt observed with periods of animation and sadness. Visible in milieu interactive with peers earlier this shift, retreats to being guarded, isolative with depressed affect as shift progressed. Received PRN Vistaril 25 mg PO at 0825 for complain of anxiety 9/10. Rates his hopelessness 0/10, depression 6 /10 with current stressor "Not being able to see my kids right now. I have 2 girls they are 67 & 29 years old". Pt remains medication compliant without discomfort. Tolerates meals well.  Support and encouragement provided to pt. Q 15 minutes safety checks maintained without incident. Pt informed of medication changes. All medications given as ordered and effects monitored.  Pt in agreement with changes to current treatment regimen. Denies concerns at this time.

## 2020-11-06 DIAGNOSIS — F332 Major depressive disorder, recurrent severe without psychotic features: Secondary | ICD-10-CM | POA: Diagnosis not present

## 2020-11-06 NOTE — Progress Notes (Signed)
D:  Patient's self inventory sheet, patient has good sleep, sleep medication helpful.  Good appetite, normal energy level, good concentration.  Denied depression, hopeless and anxiety.  Denied withdrawals.  Denied SI.  Denied physical problems.  Denied physical pain.  Goal is discharge plans.  Plans to talk to MD.  No discharge plans. A:  Medications administered per MD order.  Emotional support and encouragement given patient. R:  Denied SI and HI, contracts for safety.  Denied A/V hallucinations.  Safety maintained with 15 minute checks.

## 2020-11-06 NOTE — Progress Notes (Signed)
   11/06/20 2120  COVID-19 Daily Checkoff  Have you had a fever (temp > 37.80C/100F)  in the past 24 hours?  No  If you have had runny nose, nasal congestion, sneezing in the past 24 hours, has it worsened? No  COVID-19 EXPOSURE  Have you traveled outside the state in the past 14 days? No  Have you been in contact with someone with a confirmed diagnosis of COVID-19 or PUI in the past 14 days without wearing appropriate PPE? No  Have you been living in the same home as a person with confirmed diagnosis of COVID-19 or a PUI (household contact)? No  Have you been diagnosed with COVID-19? No

## 2020-11-06 NOTE — Progress Notes (Signed)
[facility]  11/06/2020 6:59 AM Robert PierceKesean J Benton  MRN:  [id number] Subjective:   He reports that he is doing well. He states that he has no negative effects to the medications. He reports that his mood is better. He reports that he has a new job starting next week and that he is looking forward to starting it. He reports that he is sleeping well. He reports that even though he is normally a picky eater his appetite is improved and he is starting to eat more things. He reports no SI, HI, or AVH. He reports he has been attending group therapy and is getting significant benefit from it. He reports no other current issues. Principal Problem: Recurrent major depressive disorder (HCC) Diagnosis: Principal Problem:   Recurrent major depressive disorder (HCC)  Total Time spent with patient: 20 minutes   I personally spent 20 minutes on the unit in direct patient care. The direct patient care time included face-to-face time with the patient, reviewing the patient's chart, communicating with other professionals, and coordinating care. Greater than 50% of this time was spent in counseling or coordinating care with the patient regarding goals of hospitalization, psycho-education, and discharge planning needs.   Past Psychiatric History: MDD, Anxiety, Bipolar Disorder, Personality Disorder, Mood disorder, 1 Previous Suicide Attempt, 2 prior Hospitalizations.  Past Medical History:  Past Medical History:  Diagnosis Date  . Asthma   . Bipolar 1 disorder (HCC)   . Chronic back pain   . Depression   . Headache(784.0)    Migranes  . Personality disorder (HCC)    History reviewed. No pertinent surgical history. Family History:  Family History  Problem Relation Age of Onset  . Hypertension Father   . Asthma Sister   . Stroke Paternal Grandfather        grandparents  . Heart disease Other        grandparents  . Hypertension Other        grandparents and other relative  . Kidney disease  Other        grandparents  . Diabetes Other        grandparents  . Asthma Other        grandparents  . Cancer Neg Hx    Family Psychiatric  History: Paternal Aunt and Uncle: Depression or Bipolar Disorder Multiple extended family members: EtOH use disorder Social History:  Social History   Substance and Sexual Activity  Alcohol Use No  . Alcohol/week: 0.0 standard drinks     Social History   Substance and Sexual Activity  Drug Use Yes  . Types: Marijuana    Social History   Socioeconomic History  . Marital status: Single    Spouse name: Not on file  . Number of children: Not on file  . Years of education: Not on file  . Highest education level: Not on file  Occupational History  . Not on file  Tobacco Use  . Smoking status: Former Smoker    Types: Cigarettes    Quit date: 07/29/2014    Years since quitting: 6.2  . Smokeless tobacco: Never Used  Vaping Use  . Vaping Use: Never used  Substance and Sexual Activity  . Alcohol use: No    Alcohol/week: 0.0 standard drinks  . Drug use: Yes    Types: Marijuana  . Sexual activity: Yes  Other Topics Concern  . Not on file  Social History Narrative   Alcoholic beverage: No      Drug  use: No      Seatbead Use: Yes      Firearms in home:      Exercise: 3-4 times a week,  Cardio      Smoke Alarm in your home:  yes                  Social Determinants of Health   Financial Resource Strain: Not on file  Food Insecurity: Not on file  Transportation Needs: Not on file  Physical Activity: Not on file  Stress: Not on file  Social Connections: Not on file   Additional Social History:                         Sleep: Good  Appetite:  Good  Current Medications: Current Facility-Administered Medications  Medication Dose Route Frequency Provider Last Rate Last Admin  . acetaminophen (TYLENOL) tablet 650 mg  650 mg Oral Q6H PRN Estella Husk, MD   650 mg at 11/04/20 1813  . albuterol (VENTOLIN  HFA) 108 (90 Base) MCG/ACT inhaler 2 puff  2 puff Inhalation Q6H PRN Claudie Revering, MD      . alum & mag hydroxide-simeth (MAALOX/MYLANTA) 200-200-20 MG/5ML suspension 30 mL  30 mL Oral Q4H PRN Estella Husk, MD      . FLUoxetine (PROZAC) capsule 10 mg  10 mg Oral Daily Laveda Abbe, NP   10 mg at 11/05/20 0940  . hydrOXYzine (ATARAX/VISTARIL) tablet 50 mg  50 mg Oral TID PRN Laveda Abbe, NP   50 mg at 11/05/20 2111  . LORazepam (ATIVAN) tablet 1 mg  1 mg Oral Q6H PRN Claudie Revering, MD       Or  . LORazepam (ATIVAN) injection 2 mg  2 mg Intramuscular Q6H PRN Claudie Revering, MD      . magnesium hydroxide (MILK OF MAGNESIA) suspension 30 mL  30 mL Oral Daily PRN Estella Husk, MD      . nicotine polacrilex (NICORETTE) gum 2 mg  2 mg Oral PRN Claudie Revering, MD      . OLANZapine zydis (ZYPREXA) disintegrating tablet 5 mg  5 mg Oral Q6H PRN Claudie Revering, MD      . OXcarbazepine (TRILEPTAL) tablet 150 mg  150 mg Oral BID Claudie Revering, MD   150 mg at 11/05/20 1754  . traZODone (DESYREL) tablet 50 mg  50 mg Oral QHS PRN Estella Husk, MD   50 mg at 11/05/20 2110    Lab Results:  Results for orders placed or performed during the hospital encounter of 11/02/20 (from the past 48 hour(s))  CBC with Differential/Platelet     Status: Abnormal   Collection Time: 11/04/20  6:29 PM  Result Value Ref Range   WBC 12.0 (H) 4.0 - 10.5 K/uL   RBC 5.36 4.22 - 5.81 MIL/uL   Hemoglobin 15.5 13.0 - 17.0 g/dL   HCT 34.1 93.7 - 90.2 %   MCV 82.5 80.0 - 100.0 fL   MCH 28.9 26.0 - 34.0 pg   MCHC 35.1 30.0 - 36.0 g/dL   RDW 40.9 73.5 - 32.9 %   Platelets 247 150 - 400 K/uL   nRBC 0.0 0.0 - 0.2 %   Neutrophils Relative % 51 %   Neutro Abs 6.2 1.7 - 7.7 K/uL   Lymphocytes Relative 32 %   Lymphs Abs 3.8 0.7 - 4.0 K/uL   Monocytes Relative 9 %  Monocytes Absolute 1.1 (H) 0.1 - 1.0 K/uL   Eosinophils Relative 7 %   Eosinophils Absolute 0.8 (H) 0.0 - 0.5 K/uL    Basophils Relative 1 %   Basophils Absolute 0.1 0.0 - 0.1 K/uL   Immature Granulocytes 0 %   Abs Immature Granulocytes 0.03 0.00 - 0.07 K/uL    Comment: Performed at [facility], [address] [place], [place] [address]  TSH     Status: None   Collection Time: 11/04/20  6:29 PM  Result Value Ref Range   TSH 1.325 0.350 - 4.500 uIU/mL    Comment: Performed by a 3rd Generation assay with a functional sensitivity of <=0.01 uIU/mL. Performed at [facility], [address] [place], [place] [address]   Lipid panel     Status: Abnormal   Collection Time: 11/04/20  6:29 PM  Result Value Ref Range   Cholesterol 178 0 - 200 mg/dL   Triglycerides 38 <283 mg/dL   HDL 65 >15 mg/dL   Total CHOL/HDL Ratio 2.7 RATIO   VLDL 8 0 - 40 mg/dL   LDL Cholesterol 176 (H) 0 - 99 mg/dL    Comment:        Total Cholesterol/HDL:CHD Risk Coronary Heart Disease Risk Table                     Men   Women  1/2 Average Risk   3.4   3.3  Average Risk       5.0   4.4  2 X Average Risk   9.6   7.1  3 X Average Risk  23.4   11.0        Use the calculated Patient Ratio above and the CHD Risk Table to determine the patient's CHD Risk.        ATP III CLASSIFICATION (LDL):  <100     mg/dL   Optimal  160-737  mg/dL   Near or Above                    Optimal  130-159  mg/dL   Borderline  106-269  mg/dL   High  >485     mg/dL   Very High Performed at [facility], [address], [place], [place] [address]     Blood Alcohol level:  Lab Results  Component Value Date   ETH <10 11/01/2020   ETH <10 03/13/2020    Metabolic Disorder Labs: No results found for: HGBA1C, MPG No results found for: PROLACTIN Lab Results  Component Value Date   CHOL 178 11/04/2020   TRIG 38 11/04/2020   HDL 65 11/04/2020   CHOLHDL 2.7 11/04/2020   VLDL 8 11/04/2020   LDLCALC 105 (H) 11/04/2020   LDLCALC 100 (H) 06/08/2011    Physical Findings: AIMS: Facial and  Oral Movements Muscles of Facial Expression: None, normal Lips and Perioral Area: None, normal Jaw: None, normal Tongue: None, normal,Extremity Movements Upper (arms, wrists, hands, fingers): None, normal Lower (legs, knees, ankles, toes): None, normal, Trunk Movements Neck, shoulders, hips: None, normal, Overall Severity Severity of abnormal movements (highest score from questions above): None, normal Incapacitation due to abnormal movements: None, normal Patient's awareness of abnormal movements (rate only patient's report): No Awareness, Dental Status Current problems with teeth and/or dentures?: No Does patient usually wear dentures?: No  CIWA:    COWS:     Musculoskeletal: Strength & Muscle Tone: within normal limits Gait &  Station: normal Patient leans: N/A  Psychiatric Specialty Exam: Physical Exam Vitals and nursing note reviewed.  Constitutional:      General: He is not in acute distress.    Appearance: Normal appearance. He is normal weight. He is not ill-appearing or toxic-appearing.  HENT:     Head: Normocephalic and atraumatic.  Cardiovascular:     Rate and Rhythm: Normal rate.  Pulmonary:     Effort: Pulmonary effort is normal.  Neurological:     General: No focal deficit present.     Mental Status: He is alert.     Review of Systems  Constitutional: Negative for fatigue and fever.  Respiratory: Negative for chest tightness and shortness of breath.   Cardiovascular: Negative for chest pain and palpitations.  Gastrointestinal: Negative for abdominal pain, constipation, diarrhea, nausea and vomiting.  Neurological: Negative for dizziness, weakness, light-headedness and headaches.  Psychiatric/Behavioral: Negative for suicidal ideas.     Blood pressure 122/87, pulse 67, temperature 97.7 F (36.5 C), temperature source Oral, resp. rate 16, height 5\' 10"  (1.778 m), weight 60.8 kg, SpO2 99 %.Body mass index is 19.23 kg/m.  General Appearance: Fairly Groomed   Eye Contact:  Good  Speech:  Clear and Coherent  Volume:  mildly decreased  Mood:  Dysphoric  Affect:  Depressed  Thought Process:  Coherent and Goal Directed  Orientation:  Full (Time, Place, and Person)  Thought Content:  Logical  Suicidal Thoughts:  No  Homicidal Thoughts:  No  Memory:  Immediate;   Good Recent;   Good  Judgement:  Fair  Insight:  Fair  Psychomotor Activity:  Normal  Concentration:  Concentration: Good and Attention Span: Good  Recall:  Good  Fund of Knowledge:  Good  Language:  Good  Akathisia:  Negative  Handed:  Right  AIMS (if indicated):     Assets:  Communication Skills Desire for Improvement Resilience  ADL's:  Intact  Cognition:  WNL  Sleep:  Number of Hours: 6.25    Physical Exam: Blood pressure 122/87, pulse 67, temperature 97.7 F (36.5 C), temperature source Oral, resp. rate 16, height 5\' 10"  (1.778 m), weight 60.8 kg, SpO2 99 %. Body mass index is 19.23 kg/m.   Treatment Plan Summary: Daily contact with patient to assess and evaluate symptoms and progress in treatment   Mr. Schmieder is a 30 yr old male who presents with worsening depression and SI. PPHx is significant for MDD, Anxiety, Bipolar Disorder, Personality Disorder, Mood disorder, 1 Previous Suicide Attempt, 2 prior Hospitalizations.   He is tolerating his medications well and does not need any changes to be made at this time. He should be able to discharge in the next day or two.   Bipolar Disorder: -Continue Trileptal 150 mg BID -Continue Prozac 10 mg daily   -Continue PRN's: Tylenol, Maalox, Atarax, Milk of Magnesia, Trazodone   Estimated Length of Stay: 1-2 days  Roanna Epley, MD 11/06/2020, 6:59 AM

## 2020-11-06 NOTE — Plan of Care (Signed)
Nurse discussed anxiety, depression and coping skills with patient.  

## 2020-11-06 NOTE — BHH Group Notes (Signed)
.  Psychoeducational Group Note  Date: 11/05/2020 Time: 0900-1000    Goal Setting   Purpose of Group: This group helps to provide patients with the steps of setting a goal that is specific, measurable, attainable, realistic and time specific. A discussion on how we keep ourselves stuck with negative self talk.    Participation Level:  Did not attend   Dione HousekeeperJudge, Kymir Coles A

## 2020-11-06 NOTE — BHH Suicide Risk Assessment (Signed)
[facility] INPATIENT:  Family/Significant Other Suicide Prevention Education  Suicide Prevention Education:  Education Completed;  Antonieta Pert [phone] (Girlfriend),  (name of family member/significant other) has been identified by the patient as the family member/significant other with whom the patient will be residing, and identified as the person(s) who will aid the patient in the event of a mental health crisis (suicidal ideations/suicide attempt).  With written consent from the patient, the family member/significant other has been provided the following suicide prevention education, prior to the and/or following the discharge of the patient.  The suicide prevention education provided includes the following:  Suicide risk factors  Suicide prevention and interventions  National Suicide Hotline telephone number  [facility] assessment telephone number  [facility] Emergency Assistance 911  [facility] and/or Residential Mobile Crisis Unit telephone number  Request made of family/significant other to:  Remove weapons (e.g., guns, rifles, knives), all items previously/currently identified as safety concern.    Remove drugs/medications (over-the-counter, prescriptions, illicit drugs), all items previously/currently identified as a safety concern.  The family member/significant other verbalizes understanding of the suicide prevention education information provided.  The family member/significant other agrees to remove the items of safety concern listed above.  Carloyn Jaeger Grossman-Orr 11/06/2020, 4:46 PM

## 2020-11-06 NOTE — Progress Notes (Signed)
   11/06/20 2120  Psych Admission Type (Psych Patients Only)  Admission Status Involuntary  Psychosocial Assessment  Patient Complaints None  Eye Contact Brief  Facial Expression Flat  Affect Appropriate to circumstance  Speech Logical/coherent  Interaction Assertive  Motor Activity Slow  Appearance/Hygiene Unremarkable  Behavior Characteristics Cooperative;Appropriate to situation;Calm  Mood Pleasant  Thought Process  Coherency WDL  Content WDL  Delusions WDL;None reported or observed  Perception WDL  Hallucination None reported or observed  Judgment WDL  Confusion WDL  Danger to Self  Current suicidal ideation? Denies  Danger to Others  Danger to Others None reported or observed

## 2020-11-07 LAB — HEMOGLOBIN A1C
Hgb A1c MFr Bld: 5.4 % (ref 4.8–5.6)
Mean Plasma Glucose: 108 mg/dL

## 2020-11-07 MED ORDER — TRAZODONE HCL 50 MG PO TABS: 50.0000 mg | ORAL_TABLET | Freq: Every evening | ORAL | 0 refills | Status: DC | PRN

## 2020-11-07 MED ORDER — HYDROXYZINE HCL 50 MG PO TABS: 50.0000 mg | ORAL_TABLET | Freq: Three times a day (TID) | ORAL | 0 refills | Status: DC | PRN

## 2020-11-07 MED ORDER — FLUOXETINE HCL 10 MG PO CAPS: 10.0000 mg | ORAL_CAPSULE | Freq: Every day | ORAL | 0 refills | Status: DC

## 2020-11-07 MED ORDER — OXCARBAZEPINE 150 MG PO TABS: 150.0000 mg | ORAL_TABLET | Freq: Two times a day (BID) | ORAL | 0 refills | Status: DC

## 2020-11-07 NOTE — Progress Notes (Addendum)
Pt visible on the unit this evening. Pt stated he felt good and was ready to D/C tomorrow. Pt given PRN Trazodone and Vistaril per [facility]    11/07/20 2300  Psych Admission Type (Psych Patients Only)  Admission Status Involuntary  Psychosocial Assessment  Patient Complaints Anxiety  Eye Contact Brief  Facial Expression Flat  Affect Appropriate to circumstance  Speech Logical/coherent  Interaction Assertive  Motor Activity Slow  Appearance/Hygiene Unremarkable  Behavior Characteristics Cooperative  Mood Anxious  Thought Process  Coherency WDL  Content WDL  Delusions WDL;None reported or observed  Perception WDL  Hallucination None reported or observed  Judgment WDL  Confusion WDL  Danger to Self  Current suicidal ideation? Denies  Danger to Others  Danger to Others None reported or observed

## 2020-11-07 NOTE — BHH Suicide Risk Assessment (Signed)
[facility] Discharge Suicide Risk Assessment   Principal Problem: Recurrent major depressive disorder [facility]) Discharge Diagnoses: Principal Problem:   Recurrent major depressive disorder (HCC)   Total Time spent with patient: 25 minutes  Musculoskeletal: Strength & Muscle Tone: within normal limits Gait & Station: normal Patient leans: N/A  Psychiatric Specialty Exam: Review of Systems  Constitutional: Negative for chills and fever.  HENT: Negative for hearing loss.   Eyes: Negative for blurred vision.  Respiratory: Negative for cough and shortness of breath.   Cardiovascular: Negative for chest pain and palpitations.  Gastrointestinal: Negative for constipation, diarrhea, nausea and vomiting.  Genitourinary: Negative for dysuria.  Musculoskeletal: Positive for back pain and neck pain.       Positive for chronic neck and chronic back pain   Skin: Negative for rash.  Neurological: Negative for dizziness and headaches.  Psychiatric/Behavioral: Negative for depression, hallucinations and suicidal ideas.   Blood pressure 130/89, pulse 63, temperature 97.8 F (36.6 C), temperature source Oral, resp. rate 16, height 5\' 10"  (1.778 m), weight 60.8 kg, SpO2 99 %.Body mass index is 19.23 kg/m.  General Appearance: Casual and Fairly Groomed  ::  Good  Speech:  Clear and Coherent and Normal Rate  Volume:  Normal  Mood:  Euthymic  Affect:  Appropriate and Congruent  Thought Process:  Coherent, Goal Directed and Linear  Orientation:  Full (Time, Place, and Person)  Thought Content:  Logical  Suicidal Thoughts:  No  Homicidal Thoughts:  No  Memory:  Immediate;   Good Recent;   Good Remote;   Good  Judgement:  Good  Insight:  Good  Psychomotor Activity:  Normal  Concentration:  Good  Recall:  Good  Fund of Knowledge:Good  Language: Good  Akathisia:  No    AIMS (if indicated):     Assets:  Communication Skills Desire for Improvement Housing Physical  Health Resilience Social Support Vocational/Educational  Sleep:  Number of Hours: 6.25  Cognition: WNL  ADL's:  Intact   Mental Status Per Nursing Assessment::   On Admission:  NA   Demographic Factors:  Male and Low socioeconomic status  Loss Factors: Financial problems/change in socioeconomic status  Historical Factors: Prior suicide attempts, Family history of mental illness or substance abuse and Impulsivity  Risk Reduction Factors:   Responsible for children under 47 years of age, Sense of responsibility to family, Employed, Living with another person, especially a relative and Positive social support  Continued Clinical Symptoms:  Depression - improved History of Alcohol/Substance Abuse/Dependencies More than one psychiatric diagnosis Previous Psychiatric Diagnoses and Treatments  Cognitive Features That Contribute To Risk:  None    Suicide Risk:  Mild:  Suicidal ideation of limited frequency, intensity, duration, and specificity.  There are no identifiable plans, no associated intent, mild dysphoria and related symptoms, good self-control (both objective and subjective assessment), few other risk factors, and identifiable protective factors, including available and accessible social support.   Follow-up Information    [facility], [facility]. Go on 11/10/2020.   Why: You have a hospital follow up appointment for therapy and medication management on 11/10/20 at 9:00 am.  This appointment will be held in person.  * PLEASE REQUEST [facility] DURING INTAKE (substance abuse intensive outpatient).  Contact information: [address] [place] [phone]               Plan Of Care/Follow-up recommendations:  Activity:  as tolerated  Other:  Take medications as prescribed.  Keep follow up  appointments with therapist and outpatient psychiatrist.  Participate in outpatient substance use disorder treatment program (ask about participating in  substance use disorder treatment program at [facility]).  Do not drink alcohol.  Do not use marijuana or other drugs.  Follow-up with your primary care provider for management of chronic pain and for any other medical issues.  Claudie Revering, MD 11/07/2020, 5:47 PM

## 2020-11-07 NOTE — BHH Group Notes (Signed)
Occupational Therapy Group Note Date: 11/07/2020 Group Topic/Focus: Feelings Management  Group Description: Group encouraged increased engagement and participation through discussion focused on Building Happiness. Patients were provided a handout and reviewed therapeutic strategies to build happiness including identifying gratitudes, random acts of kindness, exercise, meditation, positive journaling, and fostering relationships. Patients engaged in discussion and encouraged to reflect on each strategy and their experiences.  Therapeutic Goal(s): Identify strategies to build happiness. Identify and implement therapeutic strategies to improve overall mood. Practice and identify gratitudes, random acts of kindness, exercise, meditation, positive journaling, and fostering relationships Participation Level: Active   Participation Quality: Independent   Behavior: Calm and Cooperative   Speech/Thought Process: Focused   Affect/Mood: Constricted   Insight: Fair   Judgement: Fair   Individualization: Kaitlin was active in their participation of group discussion/activity. Pt identified "my daughters 2 and 5" as something that brings him happiness.   Modes of Intervention: Activity, Discussion and Support  Patient Response to Interventions:  Attentive and Engaged   Plan: Continue to engage patient in OT groups 2 - 3x/week.  11/07/2020  Donne Hazel, MOT, OTR/L

## 2020-11-07 NOTE — Progress Notes (Signed)
  [facility] Adult Case Management Discharge Plan :  Will you be returning to the same living situation after discharge:  Yes,  Home  At discharge, do you have transportation home?: Yes,  Family  Do you have the ability to pay for your medications: Yes,  Family and employment   Release of information consent forms completed and in the chart;  Patient's signature needed at discharge.  Patient to Follow up at:  Follow-up Information    [facility]. Go on 11/10/2020.   Why: You have a hospital follow up appointment for therapy and medication management on 11/10/20 at 9:00 am.  This appointment will be held in person.  * PLEASE REQUEST [facility] DURING INTAKE (substance abuse intensive outpatient).  Contact information: [address] [place] [address] [phone]               Next level of care provider has access to [facility] Link:no  Safety Planning and Suicide Prevention discussed: Yes,  with patient and girlfriend      Has patient been referred to the Quitline?: Patient refused referral  Patient has been referred for addiction treatment: Yes  Aram Beecham, LCSWA 11/07/2020, 3:39 PM

## 2020-11-07 NOTE — B[facility] Group Notes (Signed)
Therapy Type: Group Therapy  Participation Level:  Minimal   Patients watched a Ted Talk titled Confessions of a Depressed Comic. Patients discussed to content of this video and discussed how it feels to deal with mental illness. Patients were given a worksheet with two stick figures, one titled "What I see" and "What others See". Patients were asked to write down what they see about themselves and what others think about them. Patients were then asked what were 5 things they wish people knew/understood about them. CSW initiated discussion based on patients worksheets. Patients discussed how others view them make them feel and what they wish was understood about them.  Patient Summary:  Robert Benton stated that he is thankful for being discharged from the hospital tomorrow.  Robert Benton accepted the handouts and watched the video but did not share anything else about himself.

## 2020-11-07 NOTE — Progress Notes (Signed)
Recreation Therapy Notes  Date:  4.18.22 Time: 0930 Location: [address] Dayroom  Group Topic: Stress Management  Goal Area(s) Addresses:  Patient will identify positive stress management techniques. Patient will identify benefits of using stress management post d/c.  Behavioral Response:  Engaged  Intervention:  Stress Management  Activity: Meditation.  LRT played a meditation that was a body scan which encouraged patients to identify any sensations, tightness, hot or cool feelings they may have been feeling throughout their bodies.  Patients were to acknowledge what they were feeling and to relax those areas for complete relaxation.    Education:  Stress Management, Discharge Planning.   Education Outcome: Acknowledges Education  Clinical Observations/Feedback: Patient attended and participated in group activity.   Silvanna Ohmer Linsay, LRT/CTRS         Caroll Rancher A 11/07/2020 10:51 AM
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
## 2020-11-07 NOTE — [facility] Group Notes (Signed)
Pt did not attend group.  ADULT GRIEF GROUP NOTE:   Spiritual care group on grief and loss facilitated by Chaplain Katy Cheryln Balcom, [facility]   Group Goal:   Support / Education around grief and loss   Members engage in facilitated group support and psycho-social education.   Group Description:   Following introductions and group rules, group members engaged in facilitated group dialog and support around topic of loss, with particular support around experiences of loss in their lives. Group Identified types of loss (relationships / self / things) and identified patterns, circumstances, and changes that precipitate losses. Reflected on thoughts / feelings around loss, normalized grief responses, and recognized variety in grief experience. Group noted Worden's four tasks of grief in discussion.   Group drew on Adlerian / Rogerian, narrative, MI,    

## 2020-11-07 NOTE — Progress Notes (Signed)
[name] Progress Note  11/07/2020 3:31 PM Robert Benton  MRN:  [id number]   Subjective: Patient describes his mood as "great."  He states he feels much better than he did when he first got to the hospital.  The patient reports that he no longer has wishes not to be alive or thoughts of harming himself.  He denies SI, AI, HI, AH, VH or PI.  Patient reports that he is sleeping well and his appetite is good.  He denies irritability.  He denies anhedonia.  The patient states that he has been participating in groups and feels they have been helpful.  He is interested in discharge early tomorrow morning so that he can get to orientation for a new job.  The patient states that he has spoken to his family members who say that he sounds like he is doing better.  He denies access to firearms outside the hospital.  He denies problems with his medications or physical issues.  Objective: Medical record reviewed.  I discussed the patient's case in detail with members of the treatment team.  I met with and interviewed the patient this afternoon in the office on the unit.  On interview today, the patient appropriately engaged in our conversation.  He maintains good eye contact.  There are no motor abnormalities.  Speech is of normal rate volume and amount.  Mood is euthymic.  Affect is congruent and brighter than on admission.  Thought processes are goal-directed and coherent.  Thought content is focused on discharge and plans to start a new job.  Patient denies wish for death, SI, thoughts of harming himself, AI, HI, PI.  The patient denies perceptual abnormalities and does not appear to respond to internal stimuli.  He is alert and oriented.  Attention and concentration are grossly intact.  Insight and judgment are grossly intact.  I discussed with patient the importance of not using alcohol or substances after discharge as use of these substances will likely exacerbate his symptoms and may adversely interact with his  medications.  The patient verbalized understanding and agreement.  We discussed his medications and their indications.  Patient was provided opportunity to ask questions.  I answered his questions.  Patient verbalized understanding of the need to follow-up with therapist and outpatient psychiatrist.  I also discussed with patient the importance of participating in outpatient treatment for his substance use disorder.  Patient verbalized understanding.  Per chart review and staff report, the patient has been taking standing dose medications as prescribed.  He took as needed hydroxyzine last night and again this morning for anxiety.  The patient took as needed trazodone last night for sleep.  He has not engaged in any self-injurious or aggressive behavior.  The patient has been appropriate with his interactions with peers and staff on the unit.  He has been attending select groups and participating appropriately.   Principal Problem: Recurrent major depressive disorder ([place]) Diagnosis: Principal Problem:   Recurrent major depressive disorder ([place])  Total Time spent with patient: 25 minutes  Past Psychiatric History: See H&P  Past Medical History:  Past Medical History:  Diagnosis Date  . Asthma   . Bipolar 1 disorder ([place])   . Chronic back pain   . Depression   . Headache(784.0)    [place]  . Personality disorder ([place])    History reviewed. No pertinent surgical history. Family History:  Family History  Problem Relation Age of Onset  . Hypertension Father   . Asthma  Sister   . Stroke Paternal Grandfather        grandparents  . Heart disease Other        grandparents  . Hypertension Other        grandparents and other relative  . Kidney disease Other        grandparents  . Diabetes Other        grandparents  . Asthma Other        grandparents  . Cancer Neg Hx    Family Psychiatric  History: See H&P Social History:  Social History   Substance and Sexual Activity  Alcohol  Use No  . Alcohol/week: 0.0 standard drinks     Social History   Substance and Sexual Activity  Drug Use Yes  . Types: Marijuana    Social History   Socioeconomic History  . Marital status: Single    Spouse name: Not on file  . Number of children: Not on file  . Years of education: Not on file  . Highest education level: Not on file  Occupational History  . Not on file  Tobacco Use  . Smoking status: Former Smoker    Types: Cigarettes    Quit date: 07/29/2014    Years since quitting: 6.2  . Smokeless tobacco: Never Used  Vaping Use  . Vaping Use: Never used  Substance and Sexual Activity  . Alcohol use: No    Alcohol/week: 0.0 standard drinks  . Drug use: Yes    Types: Marijuana  . Sexual activity: Yes  Other Topics Concern  . Not on file  Social History Narrative   Alcoholic beverage: No      Drug use: No      Seatbead Use: Yes      Firearms in home:      Exercise: 3-4 times a week,  Cardio      Smoke Alarm in your home:  yes                  Social Determinants of Health   Financial Resource Strain: Not on file  Food Insecurity: Not on file  Transportation Needs: Not on file  Physical Activity: Not on file  Stress: Not on file  Social Connections: Not on file   Additional Social History:                         Sleep: Good  Appetite:  Fair  Current Medications: Current Facility-Administered Medications  Medication Dose Route Frequency Provider Last Rate Last Admin  . acetaminophen (TYLENOL) tablet 650 mg  650 mg Oral Q6H PRN Ival Bible, MD   650 mg at 11/06/20 1343  . albuterol (VENTOLIN HFA) 108 (90 Base) MCG/ACT inhaler 2 puff  2 puff Inhalation Q6H PRN Arthor Captain, MD      . alum & mag hydroxide-simeth (MAALOX/MYLANTA) 200-200-20 MG/5ML suspension 30 mL  30 mL Oral Q4H PRN Ival Bible, MD      . FLUoxetine (PROZAC) capsule 10 mg  10 mg Oral Daily Ethelene Hal, NP   10 mg at 11/07/20 0740  .  hydrOXYzine (ATARAX/VISTARIL) tablet 50 mg  50 mg Oral TID PRN Ethelene Hal, NP   50 mg at 11/07/20 0741  . LORazepam (ATIVAN) tablet 1 mg  1 mg Oral Q6H PRN Arthor Captain, MD       Or  . LORazepam (ATIVAN) injection 2 mg  2 mg Intramuscular Q6H PRN  Arthor Captain, MD      . magnesium hydroxide (MILK OF MAGNESIA) suspension 30 mL  30 mL Oral Daily PRN Ival Bible, MD      . nicotine polacrilex (NICORETTE) gum 2 mg  2 mg Oral PRN Arthor Captain, MD   2 mg at 11/07/20 0741  . OLANZapine zydis (ZYPREXA) disintegrating tablet 5 mg  5 mg Oral Q6H PRN Arthor Captain, MD      . OXcarbazepine (TRILEPTAL) tablet 150 mg  150 mg Oral BID Arthor Captain, MD   150 mg at 11/07/20 0740  . traZODone (DESYREL) tablet 50 mg  50 mg Oral QHS PRN Ival Bible, MD   50 mg at 11/06/20 2114    Lab Results: No results found for this or any previous visit (from the past 48 hour(s)).  Blood Alcohol level:  Lab Results  Component Value Date   ETH <10 11/01/2020   ETH <10 47/03/6282    Metabolic Disorder Labs: Lab Results  Component Value Date   HGBA1C 5.4 11/04/2020   MPG 108 11/04/2020   No results found for: PROLACTIN Lab Results  Component Value Date   CHOL 178 11/04/2020   TRIG 38 11/04/2020   HDL 65 11/04/2020   CHOLHDL 2.7 11/04/2020   VLDL 8 11/04/2020   LDLCALC 105 (H) 11/04/2020   LDLCALC 100 (H) 06/08/2011    Physical Findings: AIMS: Facial and Oral Movements Muscles of Facial Expression: None, normal Lips and Perioral Area: None, normal Jaw: None, normal Tongue: None, normal,Extremity Movements Upper (arms, wrists, hands, fingers): None, normal Lower (legs, knees, ankles, toes): None, normal, Trunk Movements Neck, shoulders, hips: None, normal, Overall Severity Severity of abnormal movements (highest score from questions above): None, normal Incapacitation due to abnormal movements: None, normal Patient's awareness of abnormal movements (rate only  patient's report): No Awareness, Dental Status Current problems with teeth and/or dentures?: No Does patient usually wear dentures?: No  CIWA:    COWS:     Musculoskeletal: Strength & Muscle Tone: within normal limits Gait & Station: normal Patient leans: N/A  Psychiatric Specialty Exam:  Presentation  General Appearance: Appropriate for Environment; Fairly Groomed  Eye Contact:Good  Speech:Clear and Coherent; Normal Rate  Speech Volume:Normal  Handedness:No data recorded  Mood and Affect  Mood:Euthymic  Affect:Congruent; Appropriate   Thought Process  Thought Processes:Coherent; Goal Directed  Descriptions of Associations:Intact  Orientation:Full (Time, Place and Person)  Thought Content:Logical  History of Schizophrenia/Schizoaffective disorder:No  Duration of Psychotic Symptoms:No data recorded Hallucinations:Hallucinations: None  Ideas of Reference:None  Suicidal Thoughts:Suicidal Thoughts: No  Homicidal Thoughts:Homicidal Thoughts: No   Sensorium  Memory:Immediate Good; Recent Good; Remote Good  Judgment:Good  Insight:Good   Executive Functions  Concentration:Good  Attention Span:Good  [place] of Knowledge:Good  Language:Good   Psychomotor Activity  Psychomotor Activity:Psychomotor Activity: Normal   Assets  Assets:Communication Skills; Desire for Improvement; Housing; Resilience; Social Support; Vocational/Educational; Physical Health   Sleep  Sleep:Sleep: Good    Physical Exam: Physical Exam Vitals and nursing note reviewed.  Constitutional:      Appearance: Normal appearance.  HENT:     Head: Normocephalic and atraumatic.  Neurological:     General: No focal deficit present.     Mental Status: He is alert and oriented to person, place, and time.    Review of Systems  Constitutional: Negative for chills and fever.  HENT: Negative for hearing loss.   Eyes: Negative for blurred vision.  Respiratory:  Negative  for cough and shortness of breath.   Cardiovascular: Negative for chest pain and palpitations.  Gastrointestinal: Negative for constipation, diarrhea, nausea and vomiting.  Genitourinary: Negative for dysuria.  Musculoskeletal: Positive for back pain and neck pain.       Positive for chronic neck and back pain   Skin: Negative for rash.  Neurological: Negative for dizziness and headaches.  Psychiatric/Behavioral: Negative for depression, hallucinations and suicidal ideas.   Blood pressure 130/89, pulse 63, temperature 97.8 F (36.6 C), temperature source Oral, resp. rate 16, height _0  (1.778 m), weight 60.8 kg, SpO2 99 %. Body mass index is 19.23 kg/m.   Treatment Plan Summary: Patient is a 30 year old male with a history of prior episodes of depression and alcohol use disorder as well as prior diagnoses of bipolar disorder, personality disorder and anxiety who was admitted with increased depressive symptoms and suicidal ideation in the context of multiple psychosocial stressors. The patient's mood and anxiety have improved on a combination of Prozac, Trileptal and trazodone.  Anticipate probable discharge within 24 hours.  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  Anticipate probable discharge tomorrow morning.  I certify that inpatient services furnished can reasonably be expected to improve the patient's condition.  Arthor Captain, MD 11/07/2020, 3:31 PM

## 2020-11-08 NOTE — Progress Notes (Signed)
   11/08/20 0612  Vital Signs  Temp 97.8 F (36.6 C)  Temp Source Oral  Pulse Rate 61  Pulse Rate Source Monitor  BP 125/77  BP Location Right Arm  BP Method Automatic  Patient Position (if appropriate) Sitting  Oxygen Therapy  SpO2 100 %   Discharge Note:  Patient denies SI/HI AVH at this time. Discharge instructions, AVS, prescriptions and transition record gone over with patient. Patient agrees to comply with medication management, follow-up visit, and outpatient therapy. Patient belongings returned to patient. Patient questions and concerns addressed and answered.  Patient ambulatory off unit.  Patient discharged to home.

## 2020-11-08 NOTE — Progress Notes (Signed)
Patient ID: Robert Benton, male   DOB: November 13, 1990, 30 y.o.   MRN: [id number]   Patient seen and interviewed.  Medical record reviewed.  Patient's case discussed in detail with members of the treatment team.  Subjective: Patient reports good mood.  He denies sad or depressed mood, irritability or anhedonia.  The patient denies passive wish for death or suicidal ideation.  He denies AII, HI, PI, AH or VH.  He reports that he is sleeping well and that his appetite is good.  The patient denies any side effects to medications or any physical issues.  He denies access to firearms.  Objective: Patient is casually dressed.  Grooming is appropriate.  There are no motor abnormalities.  He is cooperative and polite.  The patient maintains good eye contact.  Speech is of normal rate, volume and amount.  Mood is euthymic.  Affect is congruent.  Thought processes are coherent and goal-directed. Patient denies any thoughts of harming himself or others.   No paranoid ideation, referential thinking or delusional content is elicited.  The patient denies perceptual abnormalities.  He does not appear to respond to internal stimuli.  He is alert and oriented.  Attention and concentration are grossly intact.  Insight and judgment are intact.  A/P: Patient is a 30 year old male with a history of prior episodes of depression and alcohol use disorder as well as prior diagnoses of bipolar disorder, personality disorder and anxietywho was admitted with increased depressive symptoms and suicidal ideation in the context of multiple psychosocial stressors. Patient is now significantly improved after treatment with medications.  Mood is euthymic, affect is stable and the patient is future oriented in outlook.  The patient denies any thoughts of wanting to harm himself or others.  He denies access to firearms.  Based on my overall assessment I believe patient is stable for discharge from the inpatient setting and for management as an  outpatient.  He has been provided outpatient psychiatry and psychotherapy follow-up and has been advised to participate in substance abuse intensive outpatient program through [facility] where his outpatient mental health services will be provided.  The patient is in agreement with this plan and is in agreement with discharge today.

## 2021-10-28 ENCOUNTER — Encounter (HOSPITAL_BASED_OUTPATIENT_CLINIC_OR_DEPARTMENT_OTHER): Payer: Self-pay | Admitting: Emergency Medicine

## 2021-10-28 ENCOUNTER — Emergency Department (HOSPITAL_BASED_OUTPATIENT_CLINIC_OR_DEPARTMENT_OTHER)
Admission: EM | Admit: 2021-10-28 | Discharge: 2021-10-28 | Disposition: A | Discharge status: Home / Self Care | Payer: Self-pay | Attending: Emergency Medicine | Admitting: Emergency Medicine

## 2021-10-28 ENCOUNTER — Other Ambulatory Visit: Payer: Self-pay

## 2021-10-28 DIAGNOSIS — S00452A Superficial foreign body of left ear, initial encounter: Secondary | ICD-10-CM | POA: Insufficient documentation

## 2021-10-28 DIAGNOSIS — W4904XA Ring or other jewelry causing external constriction, initial encounter: Secondary | ICD-10-CM | POA: Insufficient documentation

## 2021-10-28 DIAGNOSIS — M795 Residual foreign body in soft tissue: Secondary | ICD-10-CM

## 2021-10-28 NOTE — ED Provider Notes (Signed)
?[facility] EMERGENCY DEPT ?Provider Note ? ? ?CSN: [id number] ?Arrival date & time: 10/28/21  0028 ? ?  ? ?History ? ?Chief Complaint  ?Patient presents with  ? ear ring in ear  ? ? ?Robert Benton is a 31 y.o. male. ? ?The history is provided by the patient.  ?Foreign Body ?Intake: left ear lobe. ?Suspected object: earring back stuck inside the ear lobe. ?Pain quality:  Aching ?Pain severity:  Moderate ?Duration: PTA. ?Timing:  Constant ?Progression:  Unchanged ?Chronicity:  New ?Worsened by:  Nothing ?Ineffective treatments:  None tried ?Associated symptoms: no abdominal pain and no vomiting   ?Risk factors: no mental health problem   ?Pushjed L earring back into the inside of the lobe PTA ?  ? ?Home Medications ?Prior to Admission medications   ?Medication Sig Start Date End Date Taking? Authorizing Provider  ?albuterol (VENTOLIN HFA) 108 (90 Base) MCG/ACT inhaler Inhale 1-2 puffs into the lungs every 6 (six) hours as needed for wheezing or shortness of breath. 04/16/20   Sponseller, Eugene Gavia, PA-C  ?FLUoxetine (PROZAC) 10 MG capsule Take 1 capsule (10 mg total) by mouth daily. 11/08/20   Laveda Abbe, NP  ?hydrOXYzine (ATARAX/VISTARIL) 50 MG tablet Take 1 tablet (50 mg total) by mouth 3 (three) times daily as needed for anxiety. 11/07/20   Laveda Abbe, NP  ?OXcarbazepine (TRILEPTAL) 150 MG tablet Take 1 tablet (150 mg total) by mouth 2 (two) times daily. 11/07/20   Laveda Abbe, NP  ?traZODone (DESYREL) 50 MG tablet Take 1 tablet (50 mg total) by mouth at bedtime as needed for sleep. 11/07/20   Laveda Abbe, NP  ?   ? ?Allergies    ?Patient has no known allergies.   ? ?Review of Systems   ?Review of Systems  ?Constitutional:  Negative for fever.  ?HENT:  Negative for facial swelling.   ?Eyes:  Negative for visual disturbance.  ?Respiratory:  Negative for wheezing and stridor.   ?Gastrointestinal:  Negative for abdominal pain and vomiting.  ?Neurological:  Negative  for facial asymmetry.  ?All other systems reviewed and are negative. ? ?Physical Exam ?Updated Vital Signs ?BP 134/77 (BP Location: Right Arm)   Pulse 89   Temp 98.2 ?F (36.8 ?C) (Oral)   Resp 18   Wt 65.8 kg   SpO2 100%   BMI 20.81 kg/m?  ?Physical Exam ?Vitals and nursing note reviewed. Exam conducted with a chaperone present.  ?Constitutional:   ?   Appearance: Normal appearance.  ?HENT:  ?   Head: Normocephalic and atraumatic.  ?   Ears:  ?   Comments: Earring back inside of lobe on left  ?   Nose: Nose normal.  ?Eyes:  ?   Conjunctiva/sclera: Conjunctivae normal.  ?   Pupils: Pupils are equal, round, and reactive to light.  ?Cardiovascular:  ?   Rate and Rhythm: Normal rate and regular rhythm.  ?   Pulses: Normal pulses.  ?   Heart sounds: Normal heart sounds.  ?Pulmonary:  ?   Effort: Pulmonary effort is normal.  ?   Breath sounds: Normal breath sounds.  ?Abdominal:  ?   General: Bowel sounds are normal.  ?   Palpations: Abdomen is soft.  ?   Tenderness: There is no abdominal tenderness. There is no guarding.  ?Musculoskeletal:     ?   General: Normal range of motion.  ?   Cervical back: Normal range of motion and neck supple.  ?Skin: ?  General: Skin is warm and dry.  ?   Capillary Refill: Capillary refill takes less than 2 seconds.  ?Neurological:  ?   General: No focal deficit present.  ?   Mental Status: He is alert and oriented to person, place, and time.  ?Psychiatric:     ?   Mood and Affect: Mood normal.     ?   Behavior: Behavior normal.  ? ? ?ED Results / Procedures / Treatments   ?Labs ?(all labs ordered are listed, but only abnormal results are displayed) ?Labs Reviewed - No data to display ? ?EKG ?None ? ?Radiology ?No results found. ? ?Procedures ?Marland KitchenForeign Body Removal ? ?Date/Time: 10/28/2021 12:59 AM ?Performed by: Cy Blamer, MD ?Authorized by: Cy Blamer, MD  ?Consent: Verbal consent obtained. ?Consent given by: patient ?Patient understanding: patient states understanding of the  procedure being performed ?Patient identity confirmed: arm band ?Intake: inside L ear lobe. ? ?Sedation: ?Patient sedated: no ? ?Patient restrained: no ?Patient cooperative: yes ?Complexity: complex ?1 objects recovered. ?Objects recovered: earring back ?Post-procedure assessment: foreign body removed ?Patient tolerance: patient tolerated the procedure well with no immediate complications ?  ? ? ?Medications Ordered in ED ?Medications - No data to display ? ?ED Course/ Medical Decision Making/ A&P ?  ?                        ?Medical Decision Making ?Earring back stuck in left ear lobe  ? ?Amount and/or Complexity of Data Reviewed ?External Data Reviewed: notes. ?   Details: previous ED notes ? ?Risk ?Risk Details: Extraction of the L earring back.  Stable for discharge with close follow up.   ? ? ? ?Final Clinical Impression(s) / ED Diagnoses ?Final diagnoses:  ?Foreign body (FB) in soft tissue  ? ?Return for intractable cough, coughing up blood, fevers > 100.4 unrelieved by medication, shortness of breath, intractable vomiting, chest pain, shortness of breath, weakness, numbness, changes in speech, facial asymmetry, abdominal pain, passing out, Inability to tolerate liquids or food, cough, altered mental status or any concerns. No signs of systemic illness or infection. The patient is nontoxic-appearing on exam and vital signs are within normal limits.  ?I have reviewed the triage vital signs and the nursing notes. Pertinent labs & imaging results that were available during my care of the patient were reviewed by me and considered in my medical decision making (see chart for details). After history, exam, and medical workup I feel the patient has been appropriately medically screened and is safe for discharge home. Pertinent diagnoses were discussed with the patient. Patient was given return precautions.  ?Rx / DC Orders ?ED Discharge Orders   ? ? None  ? ?  ? ? ?  ?Suleyma Wafer, MD ?10/28/21 0101 ? ?

## 2021-10-28 NOTE — ED Triage Notes (Signed)
Pt presents from home for ear ring backing stuck in ear lobe since just pta. ?

## 2022-02-20 DIAGNOSIS — K223 Perforation of esophagus: Secondary | ICD-10-CM

## 2022-02-20 HISTORY — DX: Perforation of esophagus: K22.3

## 2022-04-27 DIAGNOSIS — J9851 Mediastinitis: Secondary | ICD-10-CM | POA: Diagnosis not present

## 2022-04-27 DIAGNOSIS — K223 Perforation of esophagus: Secondary | ICD-10-CM | POA: Diagnosis not present

## 2022-04-27 DIAGNOSIS — J869 Pyothorax without fistula: Secondary | ICD-10-CM | POA: Diagnosis not present

## 2022-04-27 DIAGNOSIS — Z5189 Encounter for other specified aftercare: Secondary | ICD-10-CM | POA: Diagnosis not present

## 2022-05-07 DIAGNOSIS — K223 Perforation of esophagus: Secondary | ICD-10-CM | POA: Diagnosis not present

## 2022-05-23 DIAGNOSIS — J9851 Mediastinitis: Secondary | ICD-10-CM | POA: Diagnosis not present

## 2022-05-23 DIAGNOSIS — Z431 Encounter for attention to gastrostomy: Secondary | ICD-10-CM | POA: Diagnosis not present

## 2022-05-23 DIAGNOSIS — K223 Perforation of esophagus: Secondary | ICD-10-CM | POA: Diagnosis not present

## 2022-06-01 DIAGNOSIS — Z9689 Presence of other specified functional implants: Secondary | ICD-10-CM | POA: Diagnosis not present

## 2022-06-22 DIAGNOSIS — K223 Perforation of esophagus: Secondary | ICD-10-CM | POA: Diagnosis not present

## 2022-06-22 DIAGNOSIS — R5383 Other fatigue: Secondary | ICD-10-CM | POA: Diagnosis not present

## 2022-06-22 DIAGNOSIS — Z87891 Personal history of nicotine dependence: Secondary | ICD-10-CM | POA: Diagnosis not present

## 2022-06-22 DIAGNOSIS — Z4803 Encounter for change or removal of drains: Secondary | ICD-10-CM | POA: Diagnosis not present

## 2022-06-22 DIAGNOSIS — J9851 Mediastinitis: Secondary | ICD-10-CM | POA: Diagnosis not present

## 2022-06-22 DIAGNOSIS — Z4682 Encounter for fitting and adjustment of non-vascular catheter: Secondary | ICD-10-CM | POA: Diagnosis not present

## 2022-06-22 DIAGNOSIS — J869 Pyothorax without fistula: Secondary | ICD-10-CM | POA: Diagnosis not present

## 2022-06-25 DIAGNOSIS — Z4659 Encounter for fitting and adjustment of other gastrointestinal appliance and device: Secondary | ICD-10-CM | POA: Diagnosis not present

## 2022-06-25 DIAGNOSIS — K223 Perforation of esophagus: Secondary | ICD-10-CM | POA: Diagnosis not present

## 2022-06-29 DIAGNOSIS — K223 Perforation of esophagus: Secondary | ICD-10-CM | POA: Diagnosis not present
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
## 2022-08-13 DIAGNOSIS — J869 Pyothorax without fistula: Secondary | ICD-10-CM | POA: Diagnosis not present

## 2022-08-13 DIAGNOSIS — K223 Perforation of esophagus: Secondary | ICD-10-CM | POA: Diagnosis not present

## 2022-08-13 DIAGNOSIS — J9851 Mediastinitis: Secondary | ICD-10-CM | POA: Diagnosis not present

## 2022-09-25 DIAGNOSIS — J9851 Mediastinitis: Secondary | ICD-10-CM | POA: Diagnosis not present

## 2022-09-25 DIAGNOSIS — K223 Perforation of esophagus: Secondary | ICD-10-CM | POA: Diagnosis not present

## 2022-12-05 ENCOUNTER — Emergency Department (HOSPITAL_BASED_OUTPATIENT_CLINIC_OR_DEPARTMENT_OTHER)
Admission: EM | Admit: 2022-12-05 | Discharge: 2022-12-05 | Disposition: A | Discharge status: Home / Self Care | Payer: 59 | Attending: Emergency Medicine | Admitting: Emergency Medicine

## 2022-12-05 ENCOUNTER — Other Ambulatory Visit: Payer: Self-pay

## 2022-12-05 DIAGNOSIS — H73891 Other specified disorders of tympanic membrane, right ear: Secondary | ICD-10-CM | POA: Diagnosis not present

## 2022-12-05 DIAGNOSIS — J45909 Unspecified asthma, uncomplicated: Secondary | ICD-10-CM | POA: Insufficient documentation

## 2022-12-05 DIAGNOSIS — H9191 Unspecified hearing loss, right ear: Secondary | ICD-10-CM | POA: Diagnosis not present

## 2022-12-05 DIAGNOSIS — H7291 Unspecified perforation of tympanic membrane, right ear: Secondary | ICD-10-CM | POA: Diagnosis not present

## 2022-12-05 DIAGNOSIS — H7391 Unspecified disorder of tympanic membrane, right ear: Secondary | ICD-10-CM

## 2022-12-05 MED ORDER — CIPROFLOXACIN-DEXAMETHASONE 0.3-0.1 % OT SUSP
4.0000 [drp] | Freq: Once | OTIC | Status: AC
  Administered 2022-12-05: 4 [drp] via OTIC
  Filled 2022-12-05: qty 7.5

## 2022-12-05 NOTE — ED Provider Notes (Signed)
[place] EMERGENCY DEPARTMENT AT [facility] Provider Note   CSN: [id number] Arrival date & time: 12/05/22  0325     History  Chief Complaint  Patient presents with   Ear Problem    Robert Benton is a 32 y.o. male.  HPI     This is a 32 year old male who presents with decreased hearing in the right ear.  Patient reports he has noted symptoms over the last 2 weeks.  He initially noted them when he went to get his hair rinse.  He thought he had water in his ear.  7 to 10 days ago he used a Q-tip and noted some blood come out of his ear.  Since that time he has had decreased hearing.  She also recently developed a nonproductive cough.  He does smoke marijuana.  No fevers.  No other upper respiratory symptoms.  Home Medications Prior to Admission medications   Medication Sig Start Date End Date Taking? Authorizing Provider  albuterol (VENTOLIN HFA) 108 (90 Base) MCG/ACT inhaler Inhale 1-2 puffs into the lungs every 6 (six) hours as needed for wheezing or shortness of breath. 04/16/20   Sponseller, Eugene Gavia, PA-C  FLUoxetine (PROZAC) 10 MG capsule Take 1 capsule (10 mg total) by mouth daily. 11/08/20   Laveda Abbe, NP  hydrOXYzine (ATARAX/VISTARIL) 50 MG tablet Take 1 tablet (50 mg total) by mouth 3 (three) times daily as needed for anxiety. 11/07/20   Laveda Abbe, NP  OXcarbazepine (TRILEPTAL) 150 MG tablet Take 1 tablet (150 mg total) by mouth 2 (two) times daily. 11/07/20   Laveda Abbe, NP  traZODone (DESYREL) 50 MG tablet Take 1 tablet (50 mg total) by mouth at bedtime as needed for sleep. 11/07/20   Laveda Abbe, NP      Allergies    Patient has no known allergies.    Review of Systems   Review of Systems  Constitutional:  Negative for fever.  HENT:  Positive for ear discharge and hearing loss.   Respiratory:  Positive for cough.   All other systems reviewed and are negative.   Physical Exam Updated Vital Signs BP 129/80  (BP Location: Right Arm)   Pulse 90   Temp 98.4 F (36.9 C) (Oral)   Resp 17   SpO2 97%  Physical Exam Vitals and nursing note reviewed.  Constitutional:      Appearance: He is well-developed. He is not ill-appearing.  HENT:     Head: Normocephalic and atraumatic.     Ears:     Comments: Right TM with irritation noted of the external auditory canal along with what appears to be dried blood on the outside of the TM, no frank perforation noted    Nose: No congestion.  Eyes:     Pupils: Pupils are equal, round, and reactive to light.  Cardiovascular:     Rate and Rhythm: Normal rate and regular rhythm.     Heart sounds: Normal heart sounds. No murmur heard. Pulmonary:     Effort: Pulmonary effort is normal. No respiratory distress.     Breath sounds: Normal breath sounds. No wheezing.  Abdominal:     Palpations: Abdomen is soft.  Musculoskeletal:     Cervical back: Neck supple.  Lymphadenopathy:     Cervical: No cervical adenopathy.  Skin:    General: Skin is warm and dry.  Neurological:     Mental Status: He is alert and oriented to person, place, and time.  Psychiatric:  Mood and Affect: Mood normal.     ED Results / Procedures / Treatments   Labs (all labs ordered are listed, but only abnormal results are displayed) Labs Reviewed - No data to display  EKG None  Radiology No results found.  Procedures Procedures    Medications Ordered in ED Medications  ciprofloxacin-dexamethasone (CIPRODEX) 0.3-0.1 % OTIC (EAR) suspension 4 drop (has no administration in time range)    ED Course/ Medical Decision Making/ A&P                             Medical Decision Making Risk Prescription drug management.   This patient presents to the ED for concern of hearing loss, this involves an extensive number of treatment options, and is a complaint that carries with it a high risk of complications and morbidity.  I considered the following differential and  admission for this acute, potentially life threatening condition.  The differential diagnosis includes cerumen impaction, infection, perforation, foreign body  MDM:    This is a 32 year old male who presents with decreased hearing in the right ear.  He subsequently has used a Q-tip and noted some bleeding.  He has evidence of trauma and irritation to the external auditory canal.  There also appears to be some dried blood over the tympanic membrane although no frank perforation was noted.  Given diminished hearing and no obvious cerumen impaction or infection, suspect TM perforation/trauma.  Will start on Ciprodex and have him follow-up with ENT.  Regarding his cough, breath sounds are clear.  Likely an irritant cough from smoking.  Recommend smoking cessation.  (Labs, imaging, consults)  Labs: I Ordered, and personally interpreted labs.  The pertinent results include: None  Imaging Studies ordered: I ordered imaging studies including none I independently visualized and interpreted imaging. I agree with the radiologist interpretation  Additional history obtained from chart review.  External records from outside source obtained and reviewed including evaluations  Cardiac Monitoring: The patient was not maintained on a cardiac monitor.  If on the cardiac monitor, I personally viewed and interpreted the cardiac monitored which showed an underlying rhythm of: N/A  Reevaluation: After the interventions noted above, I reevaluated the patient and found that they have :improved  Social Determinants of Health:  lives independently  Disposition: Discharge  Co morbidities that complicate the patient evaluation  Past Medical History:  Diagnosis Date   Asthma    Bipolar 1 disorder (HCC)    Chronic back pain    Depression    Headache(784.0)    Migranes   Personality disorder (HCC)      Medicines Meds ordered this encounter  Medications   ciprofloxacin-dexamethasone (CIPRODEX) 0.3-0.1 %  OTIC (EAR) suspension 4 drop    I have reviewed the patients home medicines and have made adjustments as needed  Problem List / ED Course: Problem List Items Addressed This Visit   None Visit Diagnoses     Tympanic membrane irritation, right    -  Primary          \        Final Clinical Impression(s) / ED Diagnoses Final diagnoses:  Tympanic membrane irritation, right    Rx / DC Orders ED Discharge Orders     None         Elika Godar, Mayer Masker, MD 12/05/22 0406

## 2022-12-05 NOTE — ED Triage Notes (Signed)
C/o trouble hearing out of right ear x 2 weeks, and cough

## 2022-12-05 NOTE — Discharge Instructions (Signed)
You are seen today for decreased hearing in the right ear.  Given use of Q-tips and noted blood along the physical exam, I suspect you may have a small perforation of your tympanic membrane.  Instill 4 drops 2 times daily and follow-up with ENT.

## 2023-05-10 ENCOUNTER — Encounter: Payer: Self-pay | Admitting: Internal Medicine

## 2023-05-10 ENCOUNTER — Ambulatory Visit: Payer: MEDICAID | Admitting: Internal Medicine

## 2023-05-10 ENCOUNTER — Telehealth: Payer: Self-pay | Admitting: Internal Medicine

## 2023-05-10 VITALS — BP 110/70 | HR 84 | Temp 98.5°F | Ht 69.5 in | Wt 145.4 lb

## 2023-05-10 DIAGNOSIS — F339 Major depressive disorder, recurrent, unspecified: Secondary | ICD-10-CM

## 2023-05-10 DIAGNOSIS — M1990 Unspecified osteoarthritis, unspecified site: Secondary | ICD-10-CM | POA: Diagnosis not present

## 2023-05-10 DIAGNOSIS — F1091 Alcohol use, unspecified, in remission: Secondary | ICD-10-CM | POA: Insufficient documentation

## 2023-05-10 DIAGNOSIS — G4700 Insomnia, unspecified: Secondary | ICD-10-CM | POA: Diagnosis not present

## 2023-05-10 DIAGNOSIS — F3132 Bipolar disorder, current episode depressed, moderate: Secondary | ICD-10-CM

## 2023-05-10 DIAGNOSIS — J4530 Mild persistent asthma, uncomplicated: Secondary | ICD-10-CM | POA: Diagnosis not present

## 2023-05-10 DIAGNOSIS — Z024 Encounter for examination for driving license: Secondary | ICD-10-CM

## 2023-05-10 DIAGNOSIS — F1290 Cannabis use, unspecified, uncomplicated: Secondary | ICD-10-CM

## 2023-05-10 MED ORDER — HYDROXYZINE HCL 50 MG PO TABS: 50.0000 mg | ORAL_TABLET | Freq: Three times a day (TID) | ORAL | 0 refills | Status: DC | PRN

## 2023-05-10 MED ORDER — TRAZODONE HCL 50 MG PO TABS
50.0000 mg | ORAL_TABLET | Freq: Every evening | ORAL | 0 refills | Status: DC | PRN
Start: 2023-05-10 — End: 2023-06-02

## 2023-05-10 MED ORDER — FLUOXETINE HCL 10 MG PO CAPS
10.0000 mg | ORAL_CAPSULE | Freq: Every day | ORAL | 0 refills | Status: DC
Start: 2023-05-10 — End: 2023-07-30

## 2023-05-10 MED ORDER — OXCARBAZEPINE 150 MG PO TABS
150.0000 mg | ORAL_TABLET | Freq: Two times a day (BID) | ORAL | 0 refills | Status: DC
Start: 2023-05-10 — End: 2023-06-02

## 2023-05-10 MED ORDER — ALBUTEROL SULFATE HFA 108 (90 BASE) MCG/ACT IN AERS: 1.0000 | INHALATION_SPRAY | Freq: Four times a day (QID) | RESPIRATORY_TRACT | 0 refills | Status: DC | PRN

## 2023-05-10 MED ORDER — ACETAMINOPHEN 325 MG PO TABS: 325.0000 mg | ORAL_TABLET | Freq: Four times a day (QID) | ORAL | 3 refills | Status: AC | PRN

## 2023-05-10 NOTE — Telephone Encounter (Signed)
Patient dropped off document DMV, to be filled out by provider. Patient requested to send it back via Call Patient to pick up within 5-days. Document is located in providers tray at front office.Please advise at Mobile [phone] (mobile)

## 2023-05-10 NOTE — Assessment & Plan Note (Addendum)
Having maintained sobriety for one year, we discussed options for medication-assisted treatment to support continued sobriety. We will start Naltrexone to reduce cravings and enjoyment of alcohol.
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
## 2023-05-10 NOTE — Assessment & Plan Note (Addendum)
He reports intermittent suicidal thoughts but no current intent or plan. We will start Fluoxetine (Prozac) and Hydroxyzine (Atarax), with a recheck in a few weeks to assess response and adjust treatment as needed.

## 2023-05-10 NOTE — Assessment & Plan Note (Addendum)
We will restart the Albuterol inhaler.

## 2023-05-10 NOTE — Patient Instructions (Addendum)
VISIT SUMMARY:  During our visit, we discussed your bipolar disorder, depression, anxiety, insomnia, asthma, and alcohol use disorder. We also talked about your use of CBD and your desire to regain your driving privileges. You've made significant progress, especially in maintaining sobriety for a year, and we're going to support you in continuing this journey. We'll be restarting your previous medications and introducing some new ones to help manage your conditions. We'll also be referring you to Psychiatry and Behavioral Health for further evaluation and support.  YOUR PLAN:  -BIPOLAR DISORDER: Bipolar disorder is a mental health condition that causes extreme mood swings. We're restarting your Trileptal medication and referring you to Psychiatry for further management.  -DEPRESSION AND ANXIETY: Depression and anxiety are mental health disorders that can cause persistent feelings of sadness and worry. We're starting you on Fluoxetine (Prozac) and Hydroxyzine (Atarax) to help manage these feelings.  -INSOMNIA: Insomnia is a sleep disorder that can make it hard to fall asleep, stay asleep, or cause you to wake up too early. We're starting you on Trazodone to help improve your sleep.  -ASTHMA: Asthma is a condition that causes your airways to become inflamed and narrow, making it hard to breathe. We're restarting your Albuterol inhaler to help manage this.  -ALCOHOL USE DISORDER: Alcohol use disorder is a pattern of alcohol use that involves problems controlling your drinking. We're starting you on Naltrexone to help reduce cravings and enjoyment of alcohol.  We are holding off until we get labwork for this.  -CANNABIS USE: Cannabis use can have various effects on your health and may impact drug screens and driving privileges. We advised you to abstain from all cannabis products so we can better assist with getting your license reinstated, and also we believe we can improve your overall health better if  you abstain.  -DRIVER'S LICENSE REINSTATEMENT: Regaining your driving privileges will likely require a psychiatric evaluation and negative drug screens, and possibly vision testing. We'll provide a letter of support for your court date and make referrals for the necessary evaluations.  INSTRUCTIONS:  Please make sure to take your medications as prescribed and abstain from all cannabis products. We'll be scheduling a follow-up appointment in a few weeks to assess your response to the treatment and provide an updated letter of support for your court date. In the meantime, if you have any questions or concerns, don't hesitate to reach out.

## 2023-05-10 NOTE — Assessment & Plan Note (Addendum)
We will start Trazodone for sleep.

## 2023-05-10 NOTE — Assessment & Plan Note (Addendum)
He reports using CBD only and was advised on the potential impact on drug screens and driving privileges. We advised abstaining from all cannabis products for a few months to achieve a negative drug screen.

## 2023-05-10 NOTE — Assessment & Plan Note (Addendum)
Now approved for Medicaid after being off treatment due to lack of insurance, he understands the importance of consistent treatment to prevent mania and self-destructive behaviors. We will restart Trileptal 100mg  twice daily and urgently refer him to Psychiatry for further management.

## 2023-05-16 ENCOUNTER — Ambulatory Visit: Payer: MEDICAID | Admitting: Internal Medicine

## 2023-05-16 NOTE — Telephone Encounter (Signed)
I have the forms. Informed patient via my chart that he will need to schedule an appointment to have these forms completed.

## 2023-05-20 ENCOUNTER — Encounter: Payer: Self-pay | Admitting: Internal Medicine

## 2023-05-20 ENCOUNTER — Ambulatory Visit (INDEPENDENT_AMBULATORY_CARE_PROVIDER_SITE_OTHER): Payer: MEDICAID | Admitting: Internal Medicine

## 2023-05-20 VITALS — BP 122/72 | HR 92 | Temp 98.2°F | Ht 69.5 in | Wt 147.2 lb

## 2023-05-20 DIAGNOSIS — Z0001 Encounter for general adult medical examination with abnormal findings: Secondary | ICD-10-CM

## 2023-05-20 DIAGNOSIS — R0683 Snoring: Secondary | ICD-10-CM | POA: Insufficient documentation

## 2023-05-20 DIAGNOSIS — Z87898 Personal history of other specified conditions: Secondary | ICD-10-CM

## 2023-05-20 DIAGNOSIS — R400 Somnolence: Secondary | ICD-10-CM | POA: Insufficient documentation

## 2023-05-20 DIAGNOSIS — Z0000 Encounter for general adult medical examination without abnormal findings: Secondary | ICD-10-CM

## 2023-05-20 DIAGNOSIS — J309 Allergic rhinitis, unspecified: Secondary | ICD-10-CM

## 2023-05-20 NOTE — Assessment & Plan Note (Signed)
Significant sinus congestion was noted during the physical exam. We discussed the potential benefits of sinus rinse and Flonase. We recommend sinus rinse and consider Flonase for nasal congestion.

## 2023-05-20 NOTE — Patient Instructions (Signed)
VISIT SUMMARY:  During today's visit, we discussed your progress and overall health. You reported being sober for a year and making strides towards your health goals, including scheduling appointments with a psychiatrist and an optometrist. You mentioned occasional daytime fatigue and loud snoring, and we discussed the possibility of sleep apnea. Your physical exam was mostly normal, except for sinus congestion and chipped teeth. We also reviewed the importance of regular health check-ups, vision and dental care, and maintaining a healthy lifestyle.  YOUR PLAN:  -history of alcohol USE DISORDER: Substance use disorder is a condition where the use of one or more substances leads to a clinically significant impairment or distress. You have been sober for over a year, which is excellent progress. We discussed the potential use of medication to reduce cravings and noted your upcoming appointment with a psychiatrist. The plan is to continue sobriety with regular follow-ups and consider medication for cravings if needed.  -SLEEP APNEA: Sleep apnea is a sleep disorder where breathing repeatedly stops and starts. You reported loud snoring and daytime fatigue, which are common symptoms. We will order a sleep study to investigate further and consider using a sinus rinse and Flonase to improve nasal congestion.  -GENERAL HEALTH MAINTENANCE: Regular health maintenance includes routine check-ups, vision and dental exams, and a healthy lifestyle. You have not had a wellness visit in over a year, so we will complete one today and order general health panel labs. We recommend scheduling vision and dental exams and encourage regular exercise and a healthy diet.  -DENTAL HEALTH: Dental health is crucial for overall well-being. We noted chipped teeth during your exam and discussed the importance of dental care. We recommend visiting a dentist for evaluation and potential crowning of the chipped teeth.  -SINUS CONGESTION:  Sinus congestion is the blockage of the nasal passages usually due to membranes lining the nose becoming swollen from inflamed blood vessels. We recommend using a sinus rinse and considering Flonase to help alleviate your nasal congestion.  INSTRUCTIONS:  Please follow up in 1 year for your next wellness visit. Additionally, schedule a sleep study, vision and dental exams, and consider using a sinus rinse and Flonase for your sinus congestion.

## 2023-05-20 NOTE — Assessment & Plan Note (Signed)
He reports sobriety for over a year and understands the importance of maintaining this status. We discussed the potential use of medication to reduce cravings and noted his upcoming appointment with a psychiatrist. The plan is to continue sobriety with regular follow-ups and consider medication for cravings if he returns.

## 2023-05-20 NOTE — Assessment & Plan Note (Signed)
He reports loud snoring and daytime fatigue, prompting a discussion on the benefits of a sleep study and the impact of sleep apnea on overall health, including depression. We will order a sleep study and consider sinus rinse and Flonase to improve nasal congestion.
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
## 2023-05-22 ENCOUNTER — Other Ambulatory Visit (INDEPENDENT_AMBULATORY_CARE_PROVIDER_SITE_OTHER): Payer: MEDICAID

## 2023-05-22 DIAGNOSIS — R400 Somnolence: Secondary | ICD-10-CM

## 2023-05-22 DIAGNOSIS — R0683 Snoring: Secondary | ICD-10-CM | POA: Diagnosis not present

## 2023-05-22 LAB — COMPREHENSIVE METABOLIC PANEL WITH GFR
ALT: 28 U/L (ref 0–53)
AST: 27 U/L (ref 0–37)
Albumin: 4.3 g/dL (ref 3.5–5.2)
Alkaline Phosphatase: 106 U/L (ref 39–117)
BUN: 12 mg/dL (ref 6–23)
CO2: 28 meq/L (ref 19–32)
Calcium: 9.3 mg/dL (ref 8.4–10.5)
Chloride: 102 meq/L (ref 96–112)
Creatinine, Ser: 1.07 mg/dL (ref 0.40–1.50)
GFR: 92.42 mL/min
Glucose, Bld: 150 mg/dL — ABNORMAL HIGH (ref 70–99)
Potassium: 3.6 meq/L (ref 3.5–5.1)
Sodium: 138 meq/L (ref 135–145)
Total Bilirubin: 0.3 mg/dL (ref 0.2–1.2)
Total Protein: 7.3 g/dL (ref 6.0–8.3)

## 2023-05-22 LAB — LIPID PANEL
Cholesterol: 156 mg/dL (ref 0–200)
HDL: 54.5 mg/dL
LDL Cholesterol: 73 mg/dL (ref 0–99)
NonHDL: 101.9
Total CHOL/HDL Ratio: 3
Triglycerides: 145 mg/dL (ref 0.0–149.0)
VLDL: 29 mg/dL (ref 0.0–40.0)

## 2023-05-22 LAB — CBC WITH DIFFERENTIAL/PLATELET
Basophils Absolute: 0.1 10*3/uL (ref 0.0–0.1)
Basophils Relative: 0.9 % (ref 0.0–3.0)
Eosinophils Absolute: 1 10*3/uL — ABNORMAL HIGH (ref 0.0–0.7)
Eosinophils Relative: 8.1 % — ABNORMAL HIGH (ref 0.0–5.0)
HCT: 45.6 % (ref 39.0–52.0)
Hemoglobin: 14.5 g/dL (ref 13.0–17.0)
Lymphocytes Relative: 31.9 % (ref 12.0–46.0)
Lymphs Abs: 3.8 10*3/uL (ref 0.7–4.0)
MCHC: 31.9 g/dL (ref 30.0–36.0)
MCV: 84.9 fL (ref 78.0–100.0)
Monocytes Absolute: 0.8 10*3/uL (ref 0.1–1.0)
Monocytes Relative: 6.7 % (ref 3.0–12.0)
Neutro Abs: 6.3 10*3/uL (ref 1.4–7.7)
Neutrophils Relative %: 52.4 % (ref 43.0–77.0)
Platelets: 269 10*3/uL (ref 150.0–400.0)
RBC: 5.37 Mil/uL (ref 4.22–5.81)
RDW: 13.5 % (ref 11.5–15.5)
WBC: 11.9 10*3/uL — ABNORMAL HIGH (ref 4.0–10.5)

## 2023-05-22 NOTE — Addendum Note (Signed)
Addended by: Donzetta Starch on: 05/22/2023 08:45 AM   Modules accepted: Orders

## 2023-05-23 LAB — HIV ANTIBODY (ROUTINE TESTING W REFLEX): HIV 1&2 Ab, 4th Generation: NONREACTIVE

## 2023-05-24 LAB — HCV RNA QUANT RFLX ULTRA OR GENOTYP: HCV Quant Baseline: NOT DETECTED [IU]/mL

## 2023-05-24 LAB — TSH RFX ON ABNORMAL TO FREE T4: TSH: 2.17 u[IU]/mL (ref 0.450–4.500)

## 2023-05-26 ENCOUNTER — Encounter: Payer: Self-pay | Admitting: Internal Medicine

## 2023-05-26 DIAGNOSIS — D721 Eosinophilia, unspecified: Secondary | ICD-10-CM | POA: Insufficient documentation

## 2023-05-26 DIAGNOSIS — R739 Hyperglycemia, unspecified: Secondary | ICD-10-CM | POA: Insufficient documentation

## 2023-05-26 NOTE — Progress Notes (Signed)
Reviewed labs from 05/22/2023. Notable findings: Elevated glucose (150), elevated eosinophils likely related to allergies, and mild leukocytosis. Other results reassuring including normal liver function, lipids, thyroid, and negative infectious disease screening. Fasting glucose test needed. Sleep study pending for snoring/fatigue. Detailed explanation and plan provided in patient message. Follow-up at scheduled appointments: Behavioral health 06/13/2023 and PCP 07/01/2023.

## 2023-05-29 NOTE — Telephone Encounter (Signed)
Left vm for patient to call the office back or check my chart for lab results/notes.

## 2023-06-01 ENCOUNTER — Other Ambulatory Visit: Payer: Self-pay | Admitting: Internal Medicine

## 2023-06-01 DIAGNOSIS — J4530 Mild persistent asthma, uncomplicated: Secondary | ICD-10-CM

## 2023-06-01 DIAGNOSIS — F3132 Bipolar disorder, current episode depressed, moderate: Secondary | ICD-10-CM

## 2023-06-05 ENCOUNTER — Other Ambulatory Visit: Payer: Self-pay

## 2023-06-05 DIAGNOSIS — R7309 Other abnormal glucose: Secondary | ICD-10-CM

## 2023-06-05 NOTE — Telephone Encounter (Signed)
Patient's review of lab results/notes letter confirmed. Sent my chart message reminding patient to schedule a lab visit before OV on 12/9 (reminded him to fast). Also scheduled to be seen for the sleep study on 12/9 after OV with Dr. Jon Billings.

## 2023-06-11 ENCOUNTER — Telehealth: Payer: Self-pay | Admitting: Internal Medicine

## 2023-06-11 NOTE — Telephone Encounter (Signed)
Prescription Request  06/11/2023  LOV: 05/20/2023  What is the name of the medication or equipment?  albuterol (VENTOLIN HFA) 108 (90 Base) MCG/ACT inhaler   Have you contacted your pharmacy to request a refill? No   Which pharmacy would you like this sent to? [id number] [place], [place] - [address] [place] [address] Phone: [phone] Fax: [phone]   Patient notified that their request is being sent to the clinical staff for review and that they should receive a response within 2 business days.   Please advise at Mobile [phone] (mobile)

## 2023-06-12 NOTE — Telephone Encounter (Signed)
Called pharmacy and they confirmed that there are 11 refills and it's ready to be picked up. Informed patient of this information.

## 2023-06-13 ENCOUNTER — Encounter: Payer: Self-pay | Admitting: Behavioral Health

## 2023-06-13 ENCOUNTER — Ambulatory Visit: Payer: MEDICAID | Admitting: Behavioral Health

## 2023-06-13 DIAGNOSIS — F411 Generalized anxiety disorder: Secondary | ICD-10-CM | POA: Diagnosis not present

## 2023-06-13 DIAGNOSIS — F4010 Social phobia, unspecified: Secondary | ICD-10-CM | POA: Diagnosis not present

## 2023-06-13 DIAGNOSIS — F331 Major depressive disorder, recurrent, moderate: Secondary | ICD-10-CM | POA: Diagnosis not present

## 2023-06-13 NOTE — Progress Notes (Signed)
                Yurika Pereda M Shanzay Hepworth, LCMHC 
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
## 2023-07-01 ENCOUNTER — Ambulatory Visit (INDEPENDENT_AMBULATORY_CARE_PROVIDER_SITE_OTHER): Payer: MEDICAID | Admitting: Internal Medicine

## 2023-07-01 ENCOUNTER — Encounter: Payer: Self-pay | Admitting: Internal Medicine

## 2023-07-01 ENCOUNTER — Encounter: Payer: Self-pay | Admitting: Neurology

## 2023-07-01 ENCOUNTER — Ambulatory Visit (INDEPENDENT_AMBULATORY_CARE_PROVIDER_SITE_OTHER): Payer: MEDICAID | Admitting: Neurology

## 2023-07-01 VITALS — BP 120/72 | HR 92 | Temp 98.3°F | Ht 69.5 in | Wt 158.8 lb

## 2023-07-01 VITALS — BP 120/80 | HR 84 | Ht 69.0 in | Wt 158.0 lb

## 2023-07-01 DIAGNOSIS — G4719 Other hypersomnia: Secondary | ICD-10-CM

## 2023-07-01 DIAGNOSIS — Z9189 Other specified personal risk factors, not elsewhere classified: Secondary | ICD-10-CM

## 2023-07-01 DIAGNOSIS — R0683 Snoring: Secondary | ICD-10-CM

## 2023-07-01 DIAGNOSIS — F1091 Alcohol use, unspecified, in remission: Secondary | ICD-10-CM | POA: Diagnosis not present

## 2023-07-01 DIAGNOSIS — R635 Abnormal weight gain: Secondary | ICD-10-CM

## 2023-07-01 DIAGNOSIS — Z820 Family history of epilepsy and other diseases of the nervous system: Secondary | ICD-10-CM

## 2023-07-01 DIAGNOSIS — F3132 Bipolar disorder, current episode depressed, moderate: Secondary | ICD-10-CM | POA: Diagnosis not present

## 2023-07-01 DIAGNOSIS — Z87898 Personal history of other specified conditions: Secondary | ICD-10-CM | POA: Diagnosis not present

## 2023-07-01 DIAGNOSIS — R739 Hyperglycemia, unspecified: Secondary | ICD-10-CM

## 2023-07-01 DIAGNOSIS — F332 Major depressive disorder, recurrent severe without psychotic features: Secondary | ICD-10-CM

## 2023-07-01 DIAGNOSIS — R519 Headache, unspecified: Secondary | ICD-10-CM

## 2023-07-01 LAB — POCT GLYCOSYLATED HEMOGLOBIN (HGB A1C): Hemoglobin A1C: 5.8 % — AB (ref 4.0–5.6)

## 2023-07-01 MED ORDER — NALTREXONE HCL 50 MG PO TABS: 50.0000 mg | ORAL_TABLET | Freq: Every day | ORAL | 3 refills | Status: DC
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
## 2023-07-01 NOTE — Assessment & Plan Note (Addendum)
Meeting with behavioral health but psychiatry referral never happened Gave information for [facility] and rerefer He feels he is doing well except weight gain sleepiness.    07/01/2023    8:34 AM 05/20/2023    8:06 AM 05/10/2023    8:31 AM 08/04/2014    9:17 AM  Depression screen PHQ 2/9  Decreased Interest 1 0 0 2  Down, Depressed, Hopeless 1 0 1 1  PHQ - 2 Score 2 0 1 3  Altered sleeping 2 0 1 1  Tired, decreased energy 1 0 2 2  Change in appetite 2 0 2 1  Feeling bad or failure about yourself  0 0 1 2  Trouble concentrating 0 0 2 2  Moving slowly or fidgety/restless 0 0 2 3  Suicidal thoughts 0 0 0 0  PHQ-9 Score 7 0 11 14  Difficult doing work/chores Not difficult at all Not difficult at all Somewhat difficult       07/01/2023    8:35 AM 05/20/2023    8:07 AM 05/10/2023    8:32 AM  GAD 7 : Generalized Anxiety Score  Nervous, Anxious, on Edge 1 0 2  Control/stop worrying 0 0 1  Worry too much - different things 1 0 2  Trouble relaxing 0 0 2  Restless 0 0 2  Easily annoyed or irritable 0 0 2  Afraid - awful might happen 1 0 2  Total GAD 7 Score 3 0 13  Anxiety Difficulty Not difficult at all Not difficult at all Somewhat difficult   Psychiatric Medication Management   He is currently on Trileptal, trazodone, and Prozac, reporting significant sleepiness and weight gain, particularly with trazodone. He has an upcoming psychiatrist appointment on January 5th to discuss potential medication adjustments. Until then, he will continue his current psychiatric medications and we will refer him to [facility] for psychiatric evaluation and medication management.

## 2023-07-01 NOTE — Assessment & Plan Note (Signed)
Hyperglycemia   His fasting glucose level is at 150 mg/dL, which is significantly elevated, raising concerns for diabetes, especially considering his family history and symptoms of polydipsia. His last A1c check was in 2022, indicating a need for re-evaluation. We will order an A1c test and perform a fingerstick glucose test to confirm the diabetes diagnosis.

## 2023-07-01 NOTE — Patient Instructions (Addendum)
[place], [place]: Health visitor   Individualized Support: 211 [employer]: [employer] connecting people with transportation resources. Dial 211 or visit https://barton-williams.info/.  Dial 2-1-1 or (919) [phone]. They can help you find transportation options in your area based on your specific needs.     Public Transportation:  [employer] (GTA): Operates a fixed-route bus system with over 19 routes serving most areas of [place]. Offers real-time bus tracking and trip planning tools through their website and app. Provides ADA-compliant buses with ramps and designated seating for individuals with disabilities. Sells day passes, weekly passes, and monthly passes to make riding more affordable. Offers reduced fares for seniors (65+) and individuals with disabilities who meet eligibility requirements.   Offers paratransit SCAT (Shared-Ride Cab Alternative): for eligible riders with disabilities who cannot use fixed-route buses due to their disability. Phone number: [phone] https://www.[email or web address]/departments/transit  [employer] curb-to-curb bus service. Eligibility: Open to all. FeesVary, depending on service and route. There is no fee for people age 66 and over. Visit website to download application or call to have one mailed. Call to check for eligibility. http://lewis.net/ Phone: [phone] (Toll-Free) or [phone] (Main)  PART ([place] [employer]):  Operates commuter bus routes connecting [place], [place], [place], and other areas in the [place] region.  Offers weekday service with limited Saturday service on some routes.  StrategyVenture.se  Phone number: [phone]   Volunteer Transportation Services:  Mining engineer  (VTG): Provides non-emergency medical transportation services for seniors and individuals with disabilities who have difficulty accessing traditional transportation means. Serves residents of [place], [place]. Offers rides to medical appointments, grocery shopping, and other essential errands. May require scheduling rides in advance due to volunteer availability. Contact VTG through their website.  [email or web address]   [employer] (GUM): Offers transportation assistance for some programs and services they provide, such as their food pantry or medical clinic. Availability and eligibility might vary depending on the specific program. Contact GUM directly for details on transportation assistance for their programs.  https://[email or web address]/ Phone number: (947) [phone]  Ride-Sharing Assistance Programs:  [facility] Department of Social Services (DSS): May offer transportation vouchers or reimbursements for medical appointments in certain circumstances, particularly for Medicaid recipients. Eligibility depends on individual circumstances, medical needs, and program availability. Contact DSS for details on eligibility requirements and the application process. BlindWorkshop.com.pt [employer] or religious charities:  Some faith-based or local community organizations may offer limited ride-sharing assistance for essential needs like medical appointments or grocery shopping. Availability and eligibility criteria vary greatly. Research local organizations in your area to see if they offer transportation assistance programs.  Insurance-Covered Ride [employer] itself typically  does not directly cover non-emergency medical transportation (NEMT) for doctor appointments.  This means Original Medicare (Parts A & B) usually won't pay  for rides to routine checkups or doctor visits.  However, there are some exceptions:  Ambulance transport in emergencies would likely be covered by Medicare Part B. Some Medicare Advantage Plans (Part C): These are private insurance plans that may cover non-emergency medical transportation as part of their benefits package. It's important to check with your specific Medicare Advantage plan to see if NEMT is covered and what the limitations might be.  Medicaid: Medicaid programs are administered by each state, so coverage can vary. However, Medicaid generally does cover non-emergency medical transportation for eligible individuals to get to and from doctor's appointments for Medicaid-covered services. This means Medicaid may pay  for rides to [employer], hospitals, or other healthcare providers for approved treatments. Here are some resources to learn more:  Medicare Transportation: https://www.ehealthinsurance.com/medicare/ Let Medicaid Give You a Ride: MobileSolver.com.au   Remember:  Always  check with your specific health insurance plan (Medicare Advantage, commercial, or Medicaid) to confirm  coverage details for non-emergency medical transportation.Ginette Otto, [place]: Health visitor   Individualized Support: 211 [place]: [employer] connecting people with transportation resources. Dial 211 or visit https://barton-williams.info/.  Dial 2-1-1 or 978-445-4829. They can help you find transportation options in your area based on your specific needs.  Public Transportation:  [employer] (GTA): Operates a fixed-route bus system with over 19 routes serving most areas of [place]. Offers real-time bus tracking and trip planning tools through their website and app. Provides ADA-compliant buses with ramps and designated seating for individuals with disabilities. Sells day  passes, weekly passes, and monthly passes to make riding more affordable. Offers reduced fares for seniors (65+) and individuals with disabilities who meet eligibility requirements.   Offers paratransit SCAT (Shared-Ride Cab Alternative): for eligible riders with disabilities who cannot use fixed-route buses due to their disability. Phone number: ([phone])191-0877 https://www.[email or web address]/departments/transit  [employer] and [employer] curb-to-curb bus service. Eligibility: Open to all. FeesVary, depending on service and route. There is no fee for people age 66 and over. Visit website to download application or call to have one mailed. Call to check for eligibility. http://lewis.net/ Phone: [phone] (Toll-Free) or [phone] (Main)  PART ([place] [employer]):  Operates commuter bus routes connecting [place], [place], [place], and other areas in the [place] region.  Offers weekday service with limited Saturday service on some routes.  StrategyVenture.se  Phone number: [phone]   Volunteer Transportation Services:  Mining engineer (VTG): Provides non-emergency medical transportation services for seniors and individuals with disabilities who have difficulty accessing traditional transportation means. Serves residents of [place], [place]. Offers rides to medical appointments, grocery shopping, and other essential errands. May require scheduling rides in advance due to volunteer availability. Contact VTG through their website.  [email or web address]   [employer] (GUM): Offers transportation assistance for some programs and services they provide, such as their food pantry or medical clinic. Availability and eligibility might vary depending on the specific  program. Contact GUM directly for details on transportation assistance for their programs.  https://[email or web address]/ Phone number: (409) 584-8937  Ride-Sharing Assistance Programs:  [facility] Department of Social Services (DSS): May offer transportation vouchers or reimbursements for medical appointments in certain circumstances, particularly for Medicaid recipients. Eligibility depends on individual circumstances, medical needs, and program availability. Contact DSS for details on eligibility requirements and the application process. BlindWorkshop.com.pt [employer] or religious charities:  Some faith-based or local community organizations may offer limited ride-sharing assistance for essential needs like medical appointments or grocery shopping. Availability and eligibility criteria vary greatly. Research local organizations in your area to see if they offer transportation assistance programs.  Insurance-Covered Ride [employer] itself typically  does not directly cover non-emergency medical transportation (NEMT) for doctor appointments.  This means Original Medicare (Parts A & B) usually won't pay for rides to routine checkups or doctor visits.  However, there are some exceptions:  Ambulance transport in emergencies would likely be covered by Medicare Part B. Some Medicare Advantage Plans (Part C): These are private insurance plans that may cover non-emergency medical transportation as part of their benefits package. It's important to check with your specific Medicare  Advantage plan to see if NEMT is covered and what the limitations might be.  Medicaid: Medicaid programs are administered by each state, so coverage can vary. However, Medicaid generally does cover non-emergency medical transportation for eligible individuals to get to and from doctor's appointments for  Medicaid-covered services. This means Medicaid may pay for rides to [employer], hospitals, or other healthcare providers for approved treatments. Here are some resources to learn more:  Medicare Transportation: https://www.ehealthinsurance.com/medicare/ Let Medicaid Give You a Ride: MobileSolver.com.au   Remember:  Always  check with your specific health insurance plan (Medicare Advantage, commercial, or Medicaid) to confirm  coverage details for non-emergency medical transportation.**

## 2023-07-01 NOTE — Patient Instructions (Addendum)

## 2023-07-01 NOTE — Assessment & Plan Note (Signed)
Alcohol Use Disorder   With a history of DUI and current efforts towards sobriety, he is interested in using naltrexone to reduce alcohol cravings. We discussed how naltrexone could make alcohol less enjoyable and support his sobriety, deciding to start naltrexone daily in the morning and provide transportation support options.

## 2023-07-30 ENCOUNTER — Other Ambulatory Visit: Payer: Self-pay | Admitting: Internal Medicine

## 2023-07-30 DIAGNOSIS — F3132 Bipolar disorder, current episode depressed, moderate: Secondary | ICD-10-CM

## 2023-08-01 ENCOUNTER — Ambulatory Visit: Payer: MEDICAID | Admitting: Internal Medicine

## 2023-08-02 ENCOUNTER — Ambulatory Visit: Payer: MEDICAID | Admitting: Behavioral Health

## 2023-08-02 ENCOUNTER — Telehealth: Payer: Self-pay | Admitting: Behavioral Health

## 2023-08-02 NOTE — Telephone Encounter (Signed)
 The patient was scheduled for an 8:00 appointment today virtually.  I left a message at about 8:10 AM to see if there were any connection issues.  I informed him that I would be waiting for him to get on line if he got the message and if not we will schedule a follow-up appointment later.

## 2023-08-06 ENCOUNTER — Encounter: Payer: Self-pay | Admitting: Behavioral Health

## 2023-08-06 ENCOUNTER — Ambulatory Visit (INDEPENDENT_AMBULATORY_CARE_PROVIDER_SITE_OTHER): Payer: MEDICAID | Admitting: Behavioral Health

## 2023-08-06 DIAGNOSIS — F411 Generalized anxiety disorder: Secondary | ICD-10-CM

## 2023-08-06 DIAGNOSIS — F4010 Social phobia, unspecified: Secondary | ICD-10-CM

## 2023-08-06 NOTE — Progress Notes (Signed)
   Robert Benton, Robert Benton
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
## 2023-08-22 ENCOUNTER — Ambulatory Visit (INDEPENDENT_AMBULATORY_CARE_PROVIDER_SITE_OTHER): Payer: MEDICAID | Admitting: Student

## 2023-08-22 VITALS — BP 122/108 | HR 87 | Ht 69.0 in | Wt 160.8 lb

## 2023-08-22 DIAGNOSIS — F3341 Major depressive disorder, recurrent, in partial remission: Secondary | ICD-10-CM | POA: Diagnosis not present

## 2023-08-22 DIAGNOSIS — F411 Generalized anxiety disorder: Secondary | ICD-10-CM | POA: Diagnosis not present

## 2023-08-22 DIAGNOSIS — F1091 Alcohol use, unspecified, in remission: Secondary | ICD-10-CM | POA: Diagnosis not present

## 2023-08-22 DIAGNOSIS — F39 Unspecified mood [affective] disorder: Secondary | ICD-10-CM

## 2023-08-22 DIAGNOSIS — F1290 Cannabis use, unspecified, uncomplicated: Secondary | ICD-10-CM

## 2023-08-22 DIAGNOSIS — Z720 Tobacco use: Secondary | ICD-10-CM

## 2023-08-22 MED ORDER — HYDROXYZINE HCL 25 MG PO TABS: 25.0000 mg | ORAL_TABLET | Freq: Three times a day (TID) | ORAL | 1 refills | Status: DC | PRN
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
## 2023-08-26 ENCOUNTER — Encounter (HOSPITAL_COMMUNITY): Payer: Self-pay | Admitting: Student

## 2023-08-26 ENCOUNTER — Ambulatory Visit: Payer: MEDICAID | Admitting: Internal Medicine

## 2023-08-28 ENCOUNTER — Ambulatory Visit: Payer: MEDICAID | Admitting: Behavioral Health

## 2023-08-29 ENCOUNTER — Encounter (HOSPITAL_COMMUNITY): Payer: Self-pay | Admitting: Student

## 2023-08-29 DIAGNOSIS — F3341 Major depressive disorder, recurrent, in partial remission: Secondary | ICD-10-CM | POA: Insufficient documentation

## 2023-08-29 DIAGNOSIS — Z720 Tobacco use: Secondary | ICD-10-CM | POA: Insufficient documentation

## 2023-08-29 DIAGNOSIS — F411 Generalized anxiety disorder: Secondary | ICD-10-CM | POA: Insufficient documentation

## 2023-09-12 ENCOUNTER — Ambulatory Visit: Payer: MEDICAID | Admitting: Behavioral Health

## 2023-09-12 ENCOUNTER — Encounter: Payer: Self-pay | Admitting: Behavioral Health

## 2023-09-12 DIAGNOSIS — F411 Generalized anxiety disorder: Secondary | ICD-10-CM
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
## 2023-09-13 ENCOUNTER — Ambulatory Visit (INDEPENDENT_AMBULATORY_CARE_PROVIDER_SITE_OTHER): Payer: MEDICAID | Admitting: Internal Medicine

## 2023-09-13 ENCOUNTER — Other Ambulatory Visit (HOSPITAL_BASED_OUTPATIENT_CLINIC_OR_DEPARTMENT_OTHER): Payer: Self-pay

## 2023-09-13 VITALS — BP 129/78 | HR 87 | Temp 98.6°F | Ht 69.0 in | Wt 165.2 lb

## 2023-09-13 DIAGNOSIS — R400 Somnolence: Secondary | ICD-10-CM | POA: Diagnosis not present

## 2023-09-13 DIAGNOSIS — R739 Hyperglycemia, unspecified: Secondary | ICD-10-CM

## 2023-09-13 DIAGNOSIS — R5383 Other fatigue: Secondary | ICD-10-CM

## 2023-09-13 DIAGNOSIS — F1290 Cannabis use, unspecified, uncomplicated: Secondary | ICD-10-CM

## 2023-09-13 DIAGNOSIS — D72829 Elevated white blood cell count, unspecified: Secondary | ICD-10-CM

## 2023-09-13 DIAGNOSIS — R635 Abnormal weight gain: Secondary | ICD-10-CM

## 2023-09-13 DIAGNOSIS — Z87898 Personal history of other specified conditions: Secondary | ICD-10-CM

## 2023-09-13 DIAGNOSIS — F411 Generalized anxiety disorder: Secondary | ICD-10-CM

## 2023-09-13 DIAGNOSIS — J4530 Mild persistent asthma, uncomplicated: Secondary | ICD-10-CM

## 2023-09-13 MED ORDER — NEBULIZER/TUBING/MOUTHPIECE KIT: 1.0000 | PACK | 1 refills | Status: AC | PRN

## 2023-09-13 MED ORDER — NEBULIZER/TUBING/MOUTHPIECE KIT
1.0000 | PACK | 1 refills | Status: DC | PRN
  Filled 2023-09-13: qty 1, fill #0

## 2023-09-13 MED ORDER — AIRSUPRA 90-80 MCG/ACT IN AERO: 2.0000 | INHALATION_SPRAY | RESPIRATORY_TRACT | 11 refills | Status: DC | PRN

## 2023-09-13 MED ORDER — IPRATROPIUM-ALBUTEROL 0.5-2.5 (3) MG/3ML IN SOLN: 3.0000 mL | RESPIRATORY_TRACT | 1 refills | Status: AC | PRN
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
## 2023-09-14 LAB — CBC WITH DIFFERENTIAL/PLATELET
Absolute Lymphocytes: 2275 {cells}/uL (ref 850–3900)
Absolute Monocytes: 733 {cells}/uL (ref 200–950)
Basophils Absolute: 56 {cells}/uL (ref 0–200)
Basophils Relative: 0.6 %
Eosinophils Absolute: 1006 {cells}/uL — ABNORMAL HIGH (ref 15–500)
Eosinophils Relative: 10.7 %
HCT: 43.2 % (ref 38.5–50.0)
Hemoglobin: 14.4 g/dL (ref 13.2–17.1)
MCH: 27.3 pg (ref 27.0–33.0)
MCHC: 33.3 g/dL (ref 32.0–36.0)
MCV: 82 fL (ref 80.0–100.0)
MPV: 10.6 fL (ref 7.5–12.5)
Monocytes Relative: 7.8 %
Neutro Abs: 5330 {cells}/uL (ref 1500–7800)
Neutrophils Relative %: 56.7 %
Platelets: 285 10*3/uL (ref 140–400)
RBC: 5.27 10*6/uL (ref 4.20–5.80)
RDW: 13.1 % (ref 11.0–15.0)
Total Lymphocyte: 24.2 %
WBC: 9.4 10*3/uL (ref 3.8–10.8)

## 2023-09-14 LAB — TESTOSTERONE: Testosterone: 587 ng/dL (ref 250–827)

## 2023-09-14 LAB — HEMOGLOBIN A1C
Hgb A1c MFr Bld: 5.7 %{Hb} — ABNORMAL HIGH (ref <5.7)
Mean Plasma Glucose: 117 mg/dL
eAG (mmol/L): 6.5 mmol/L

## 2023-09-14 LAB — TSH+FREE T4: TSH W/REFLEX TO FT4: 1.1 m[IU]/L (ref 0.40–4.50)

## 2023-09-14 NOTE — Assessment & Plan Note (Signed)
 Frequent inhaler use and occasional shortness of breath noted. Discussed benefits of a nebulizer and upgraded inhaler for better management. Write order for nebulizer, prescribe upgraded inhaler, and create an asthma action plan.

## 2023-09-14 NOTE — Assessment & Plan Note (Signed)
 Alcohol Use Disorder reported to still be in remission.  Currently taking naltrexone, effectively reducing alcohol cravings. Committed to sobriety. Discussed long-term benefits of sobriety and risks of alcohol use. Continue naltrexone as prescribed.

## 2023-09-14 NOTE — Assessment & Plan Note (Signed)
 Prediabetes Previous A1c was 5.8 with a significant family history of diabetes. Emphasized the importance of monitoring blood sugar levels and the risk of progression to diabetes. Recheck A1c and perform a finger stick glucose test.

## 2023-09-14 NOTE — Assessment & Plan Note (Signed)
 Occasional vaping with reduced usage. Discussed tapering off to avoid withdrawal symptoms and potential impact on testosterone levels and weight gain. Discuss tapering off cannabis use to avoid withdrawal symptoms.

## 2023-09-14 NOTE — Assessment & Plan Note (Signed)
 Currently taking Atarax at a reduced dosage due to daytime drowsiness. Discussed potential need for alternative anxiety management with psychiatrist. Continue Atarax at reduced dosage and discuss potential need for alternative anxiety management with psychiatrist.

## 2023-09-14 NOTE — Assessment & Plan Note (Signed)
 Reports loud snoring and daytime fatigue with difficulty scheduling a sleep test. Highlighted the importance of diagnosing and treating sleep apnea to prevent serious health issues. Order a home sleep test.

## 2023-09-17 ENCOUNTER — Ambulatory Visit (HOSPITAL_COMMUNITY): Payer: MEDICAID | Admitting: Licensed Clinical Social Worker

## 2023-09-19 ENCOUNTER — Telehealth (HOSPITAL_COMMUNITY): Payer: MEDICAID | Admitting: Student

## 2023-09-19 ENCOUNTER — Encounter (HOSPITAL_COMMUNITY): Payer: Self-pay | Admitting: Student

## 2023-09-19 DIAGNOSIS — F1091 Alcohol use, unspecified, in remission: Secondary | ICD-10-CM | POA: Diagnosis not present

## 2023-09-19 DIAGNOSIS — Z720 Tobacco use: Secondary | ICD-10-CM

## 2023-09-19 DIAGNOSIS — F39 Unspecified mood [affective] disorder: Secondary | ICD-10-CM

## 2023-09-19 DIAGNOSIS — F17210 Nicotine dependence, cigarettes, uncomplicated: Secondary | ICD-10-CM

## 2023-09-19 DIAGNOSIS — F3341 Major depressive disorder, recurrent, in partial remission: Secondary | ICD-10-CM | POA: Diagnosis not present

## 2023-09-19 DIAGNOSIS — F411 Generalized anxiety disorder: Secondary | ICD-10-CM

## 2023-09-19 DIAGNOSIS — F1290 Cannabis use, unspecified, uncomplicated: Secondary | ICD-10-CM

## 2023-09-19 MED ORDER — HYDROXYZINE HCL 25 MG PO TABS: 25.0000 mg | ORAL_TABLET | Freq: Two times a day (BID) | ORAL | 2 refills | Status: DC | PRN
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
## 2023-10-10 ENCOUNTER — Ambulatory Visit: Payer: MEDICAID | Admitting: Behavioral Health

## 2023-10-16 ENCOUNTER — Ambulatory Visit: Payer: MEDICAID | Admitting: Internal Medicine

## 2023-10-18 ENCOUNTER — Ambulatory Visit (INDEPENDENT_AMBULATORY_CARE_PROVIDER_SITE_OTHER): Payer: MEDICAID | Admitting: Internal Medicine

## 2023-10-18 ENCOUNTER — Encounter: Payer: Self-pay | Admitting: Internal Medicine

## 2023-10-18 VITALS — BP 109/72 | HR 82 | Temp 98.0°F | Ht 69.0 in | Wt 157.0 lb

## 2023-10-18 DIAGNOSIS — F3132 Bipolar disorder, current episode depressed, moderate: Secondary | ICD-10-CM | POA: Diagnosis not present

## 2023-10-18 DIAGNOSIS — F1290 Cannabis use, unspecified, uncomplicated: Secondary | ICD-10-CM

## 2023-10-18 DIAGNOSIS — D721 Eosinophilia, unspecified: Secondary | ICD-10-CM

## 2023-10-18 DIAGNOSIS — J4530 Mild persistent asthma, uncomplicated: Secondary | ICD-10-CM

## 2023-10-18 DIAGNOSIS — R7303 Prediabetes: Secondary | ICD-10-CM

## 2023-10-18 DIAGNOSIS — Z720 Tobacco use: Secondary | ICD-10-CM

## 2023-10-18 MED ORDER — BUDESONIDE-FORMOTEROL FUMARATE 80-4.5 MCG/ACT IN AERO: 2.0000 | INHALATION_SPRAY | Freq: Two times a day (BID) | RESPIRATORY_TRACT | 3 refills | Status: DC

## 2023-10-18 MED ORDER — FLUOXETINE HCL 20 MG PO TABS: 20.0000 mg | ORAL_TABLET | Freq: Every day | ORAL | 3 refills | Status: DC

## 2023-10-18 MED ORDER — AIRSUPRA 90-80 MCG/ACT IN AERO: 2.0000 | INHALATION_SPRAY | RESPIRATORY_TRACT | 11 refills | Status: DC | PRN
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
## 2023-10-19 NOTE — Assessment & Plan Note (Signed)
 He aims to stop cannabis use by his birthday. Emphasis is placed on focusing on personal goals and replacing cannabis with positive activities. Encourage focus on personal goals to reduce cannabis use and discuss replacing cannabis with positive activities.

## 2023-10-19 NOTE — Patient Instructions (Signed)
  YOUR PLAN:  -ASTHMA: Asthma is a condition where your airways narrow and swell, making it difficult to breathe. Your asthma worsens during spring and cold weather. We discussed an asthma action plan and the use of a spacer to improve inhaler effectiveness. You will receive refills for your inhalers, a prescription for Airsupra with a savings card, and a spacer.  -GENERALIZED ANXIETY DISORDER (GAD): Generalized anxiety disorder is characterized by excessive, uncontrollable worry about various aspects of life. You are currently taking Fluoxetine (Prozac) for anxiety but are experiencing irritability at work. We will increase your Fluoxetine dose to 20 mg daily and monitor your mood and irritability.  -BIPOLAR DISORDER: Bipolar disorder is a mental health condition that causes extreme mood swings. Your condition is managed with Oxcarbazepine (Trileptal) and monthly psychiatric follow-ups. It is important to continue your medication and regular psychiatric visits.  -PREDIABETES: Prediabetes is a condition where blood sugar levels are higher than normal but not high enough to be classified as diabetes. You have been actively losing weight and modifying your diet, which is essential to prevent diabetes. Continue with your weight loss and dietary changes, and we will order follow-up labs for blood glucose in 1-2 months.  -NICOTINE DEPENDENCE, IN REMISSION: Nicotine dependence is an addiction to tobacco products. You have been abstinent from smoking for a week. Continue to stay smoke-free for your health, and we can provide additional support if needed.  -CANNABIS USE: You are working on reducing your cannabis use with a goal to stop by your birthday. Focus on your personal goals and consider replacing cannabis with positive activities.  -GENERAL HEALTH MAINTENANCE: You are making positive lifestyle changes, including weight loss, dietary modifications, and increased physical activity. We recommend vitamin D  supplementation due to your limited sun exposure from working night shifts, which can benefit your mood and bone health.  INSTRUCTIONS:  Please follow up in 1-2 months for blood glucose labs. Continue with your current medications and lifestyle changes. If you experience any issues or have questions, do not hesitate to contact us.

## 2023-10-19 NOTE — Assessment & Plan Note (Signed)
 His asthma worsens during spring and cold weather, with elevated eosinophil levels indicating allergies. He uses both a rescue and a controller inhaler. An asthma action plan and spacer use were discussed to improve inhaler effectiveness. Provide an asthma action plan, refill inhalers, prescribe Airsupra with a savings card, and provide a spacer.  We discussed the action plan at length

## 2023-10-19 NOTE — Assessment & Plan Note (Signed)
 He has been abstinent from smoking for a week and is encouraged to maintain abstinence for health benefits. Encourage continued smoking abstinence and provide smoking cessation support if needed.

## 2023-10-23 ENCOUNTER — Other Ambulatory Visit (INDEPENDENT_AMBULATORY_CARE_PROVIDER_SITE_OTHER): Payer: MEDICAID

## 2023-10-23 DIAGNOSIS — R7309 Other abnormal glucose: Secondary | ICD-10-CM

## 2023-10-23 LAB — COMPREHENSIVE METABOLIC PANEL WITH GFR
ALT: 22 U/L (ref 0–53)
AST: 22 U/L (ref 0–37)
Albumin: 4.4 g/dL (ref 3.5–5.2)
Alkaline Phosphatase: 93 U/L (ref 39–117)
BUN: 10 mg/dL (ref 6–23)
CO2: 26 meq/L (ref 19–32)
Calcium: 9.1 mg/dL (ref 8.4–10.5)
Chloride: 104 meq/L (ref 96–112)
Creatinine, Ser: 1.02 mg/dL (ref 0.40–1.50)
GFR: 97.6 mL/min (ref >60.00)
Glucose, Bld: 98 mg/dL (ref 70–99)
Potassium: 3.6 meq/L (ref 3.5–5.1)
Sodium: 140 meq/L (ref 135–145)
Total Bilirubin: 0.3 mg/dL (ref 0.2–1.2)
Total Protein: 7 g/dL (ref 6.0–8.3)

## 2023-11-14 ENCOUNTER — Telehealth (HOSPITAL_COMMUNITY): Payer: MEDICAID | Admitting: Student

## 2023-11-14 ENCOUNTER — Encounter (HOSPITAL_COMMUNITY): Payer: Self-pay

## 2023-11-15 ENCOUNTER — Telehealth (HOSPITAL_COMMUNITY): Payer: MEDICAID | Admitting: Student

## 2023-11-15 ENCOUNTER — Encounter (HOSPITAL_COMMUNITY): Payer: Self-pay | Admitting: Student

## 2023-11-15 DIAGNOSIS — F411 Generalized anxiety disorder: Secondary | ICD-10-CM | POA: Diagnosis not present

## 2023-11-15 DIAGNOSIS — Z8659 Personal history of other mental and behavioral disorders: Secondary | ICD-10-CM | POA: Diagnosis not present

## 2023-11-15 DIAGNOSIS — F3341 Major depressive disorder, recurrent, in partial remission: Secondary | ICD-10-CM | POA: Diagnosis not present

## 2023-11-15 DIAGNOSIS — F1091 Alcohol use, unspecified, in remission: Secondary | ICD-10-CM

## 2023-11-15 DIAGNOSIS — F39 Unspecified mood [affective] disorder: Secondary | ICD-10-CM

## 2023-11-15 MED ORDER — OXCARBAZEPINE 150 MG PO TABS: 150.0000 mg | ORAL_TABLET | Freq: Two times a day (BID) | ORAL | 1 refills | Status: DC

## 2023-11-15 MED ORDER — HYDROXYZINE HCL 25 MG PO TABS: 25.0000 mg | ORAL_TABLET | Freq: Two times a day (BID) | ORAL | 2 refills | Status: DC | PRN
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
## 2024-01-03 ENCOUNTER — Telehealth (INDEPENDENT_AMBULATORY_CARE_PROVIDER_SITE_OTHER): Payer: MEDICAID | Admitting: Student

## 2024-01-03 ENCOUNTER — Encounter (HOSPITAL_COMMUNITY): Payer: Self-pay | Admitting: Student

## 2024-01-03 DIAGNOSIS — F411 Generalized anxiety disorder: Secondary | ICD-10-CM | POA: Diagnosis not present

## 2024-01-03 DIAGNOSIS — F1091 Alcohol use, unspecified, in remission: Secondary | ICD-10-CM

## 2024-01-03 DIAGNOSIS — F39 Unspecified mood [affective] disorder: Secondary | ICD-10-CM

## 2024-01-03 DIAGNOSIS — Z8659 Personal history of other mental and behavioral disorders: Secondary | ICD-10-CM | POA: Diagnosis not present

## 2024-01-03 DIAGNOSIS — F3341 Major depressive disorder, recurrent, in partial remission: Secondary | ICD-10-CM | POA: Diagnosis not present

## 2024-01-03 MED ORDER — HYDROXYZINE HCL 25 MG PO TABS: 25.0000 mg | ORAL_TABLET | Freq: Two times a day (BID) | ORAL | 2 refills | Status: AC | PRN
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
## 2024-01-12 ENCOUNTER — Other Ambulatory Visit: Payer: Self-pay | Admitting: Internal Medicine

## 2024-01-12 DIAGNOSIS — F3132 Bipolar disorder, current episode depressed, moderate: Secondary | ICD-10-CM

## 2024-03-12 ENCOUNTER — Encounter: Payer: Self-pay | Admitting: Internal Medicine

## 2024-03-21 ENCOUNTER — Emergency Department (HOSPITAL_BASED_OUTPATIENT_CLINIC_OR_DEPARTMENT_OTHER): Payer: MEDICAID | Admitting: Radiology

## 2024-03-21 ENCOUNTER — Emergency Department (HOSPITAL_BASED_OUTPATIENT_CLINIC_OR_DEPARTMENT_OTHER)
Admission: EM | Admit: 2024-03-21 | Discharge: 2024-03-21 | Disposition: A | Discharge status: Home / Self Care | Payer: MEDICAID | Source: Ambulatory Visit | Attending: Emergency Medicine | Admitting: Emergency Medicine

## 2024-03-21 ENCOUNTER — Encounter (HOSPITAL_BASED_OUTPATIENT_CLINIC_OR_DEPARTMENT_OTHER): Payer: Self-pay | Admitting: Emergency Medicine

## 2024-03-21 DIAGNOSIS — J45909 Unspecified asthma, uncomplicated: Secondary | ICD-10-CM | POA: Insufficient documentation

## 2024-03-21 DIAGNOSIS — R072 Precordial pain: Secondary | ICD-10-CM | POA: Diagnosis not present

## 2024-03-21 DIAGNOSIS — R079 Chest pain, unspecified: Secondary | ICD-10-CM | POA: Diagnosis present

## 2024-03-21 MED ORDER — ALUM & MAG HYDROXIDE-SIMETH 200-200-20 MG/5ML PO SUSP
30.0000 mL | Freq: Once | ORAL | Status: AC
  Administered 2024-03-21: 30 mL via ORAL
  Filled 2024-03-21: qty 30

## 2024-03-21 MED ORDER — SUCRALFATE 1 G PO TABS: 1.0000 g | ORAL_TABLET | Freq: Three times a day (TID) | ORAL | 0 refills | Status: AC

## 2024-03-21 MED ORDER — PANTOPRAZOLE SODIUM 20 MG PO TBEC: 20.0000 mg | DELAYED_RELEASE_TABLET | Freq: Every day | ORAL | 0 refills | Status: DC

## 2024-03-21 NOTE — ED Provider Notes (Signed)
 [place] EMERGENCY DEPARTMENT AT [facility] Provider Note   CSN: [id number] Arrival date & time: 03/21/24  8144     Patient presents with: Chest Pain   Robert Benton is a 33 y.o. male.   The history is provided by the patient and medical records. No language interpreter was used.  Chest Pain    33 year old male history of asthma, bipolar, personality disorder, polysubstance use, presenting with complaints of chest pain.  For the past 2 weeks patient has had intermittent pain in his chest.  Pain is substernal lasting for 5 to 10 minutes at a time sometimes brought on by eating when he lays down.  He endorsed occasional cough and some sore throat.  He does have mild nausea without vomiting.  No fever no chills no shortness of breath but does endorse occasional wheezing due to his asthma.  He is using his inhaler more than usual.  Denies any abdominal pain or back pain denies any significant cardiac history.  Patient does use tobacco product but denies heavy alcohol use.  He went to urgent care center today and had negative COVID flu and RSV test but was sent here for further assessment including chest x-ray.  Prior to Admission medications   Medication Sig Start Date End Date Taking? Authorizing Provider  acetaminophen  (TYLENOL ) 325 MG tablet Take 1 tablet (325 mg total) by mouth every 6 (six) hours as needed. 05/10/23   Jesus Bernardino MATSU, MD  albuterol  (VENTOLIN  HFA) 108 (90 Base) MCG/ACT inhaler INHALE 1-2 PUFFS BY MOUTH EVERY 6 HOURS AS NEEDED FOR WHEEZE OR SHORTNESS OF BREATH 06/02/23   Jesus Bernardino MATSU, MD  Albuterol -Budesonide  (AIRSUPRA ) 90-80 MCG/ACT AERO Inhale 2 Inhalations into the lungs every 4 (four) hours as needed. Replaces albuterol , rescue inhaler 10/18/23   Jesus Bernardino MATSU, MD  budesonide -formoterol  (SYMBICORT ) 80-4.5 MCG/ACT inhaler Inhale 2 puffs into the lungs 2 (two) times daily. 10/18/23   Jesus Bernardino MATSU, MD  FLUoxetine  (PROZAC ) 10 MG capsule TAKE 1  CAPSULE BY MOUTH EVERY DAY 01/12/24   Jesus Bernardino MATSU, MD  hydrOXYzine  (ATARAX ) 25 MG tablet Take 1 tablet (25 mg total) by mouth 2 (two) times daily as needed for anxiety. 01/03/24 04/02/24  Rainelle Pfeiffer, MD  ipratropium-albuterol  (DUONEB) 0.5-2.5 (3) MG/3ML SOLN Take 3 mLs by nebulization every 4 (four) hours as needed. 09/13/23   Jesus Bernardino MATSU, MD  OXcarbazepine  (TRILEPTAL ) 150 MG tablet Take 1 tablet (150 mg total) by mouth 2 (two) times daily. 11/15/23   Rainelle Pfeiffer, MD  Respiratory Therapy Supplies (NEBULIZER/TUBING/MOUTHPIECE) KIT 1 each by Does not apply route every 4 (four) hours as needed. 09/13/23   Jesus Bernardino MATSU, MD    Allergies: Patient has no known allergies.    Review of Systems  Cardiovascular:  Positive for chest pain.  All other systems reviewed and are negative.   Updated Vital Signs BP 115/71   Pulse 83   Temp 98.1 F (36.7 C) (Oral)   Resp 19   SpO2 96%   Physical Exam Constitutional:      General: He is not in acute distress.    Appearance: He is well-developed.  HENT:     Head: Atraumatic.     Mouth/Throat:     Pharynx: Oropharynx is clear.  Eyes:     Conjunctiva/sclera: Conjunctivae normal.  Cardiovascular:     Rate and Rhythm: Normal rate and regular rhythm.     Pulses: Normal pulses.     Heart sounds: Normal heart sounds.  Pulmonary:     Effort: Pulmonary effort is normal.     Breath sounds: Normal breath sounds. No wheezing, rhonchi or rales.  Abdominal:     Palpations: Abdomen is soft.     Tenderness: There is no abdominal tenderness.  Musculoskeletal:        General: Normal range of motion.     Cervical back: Normal range of motion and neck supple.  Skin:    Findings: No rash.  Neurological:     Mental Status: He is alert.     (all labs ordered are listed, but only abnormal results are displayed) Labs Reviewed - No data to display  EKG: None  Date: 03/21/2024  Rate: 77  Rhythm: normal sinus rhythm  QRS Axis: normal   Intervals: normal  ST/T Wave abnormalities: normal  Conduction Disutrbances: none  Narrative Interpretation: early repol  Old EKG Reviewed: No significant changes noted    Radiology: DG Chest 2 View Result Date: 03/21/2024 CLINICAL DATA:  Chest pain.  Cough and sore throat. EXAM: CHEST - 2 VIEW COMPARISON:  Chest radiograph 03/13/2020 FINDINGS: The cardiomediastinal contours are normal. Mild left lung base scarring. Pulmonary vasculature is normal. No consolidation, pleural effusion, or pneumothorax. Chronic left eighth rib abnormality. No acute osseous abnormalities are seen. IMPRESSION: No acute chest findings. Mild left lung base scarring. Electronically Signed   By: Andrea Gasman M.D.   On: 03/21/2024 19:57     Procedures   Medications Ordered in the ED  alum & mag hydroxide-simeth (MAALOX/MYLANTA) 200-200-20 MG/5ML suspension 30 mL (30 mLs Oral Given 03/21/24 1950)                                    Medical Decision Making Amount and/or Complexity of Data Reviewed Radiology: ordered. ECG/medicine tests: ordered.  Risk OTC drugs.   BP 115/71   Pulse 83   Temp 98.1 F (36.7 C) (Oral)   Resp 19   SpO2 96%   61:77 PM  33 year old male history of asthma, bipolar, personality disorder, polysubstance use, presenting with complaints of chest pain.  For the past 2 weeks patient has had intermittent pain in his chest.  Pain is substernal lasting for 5 to 10 minutes at a time sometimes brought on by eating when he lays down.  He endorsed occasional cough and some sore throat.  He does have mild nausea without vomiting.  No fever no chills no shortness of breath but does endorse occasional wheezing due to his asthma.  He is using his inhaler more than usual.  Denies any abdominal pain or back pain denies any significant cardiac history.  Patient does use tobacco product but denies heavy alcohol use.  He went to urgent care center today and had negative COVID flu and RSV test but was  sent here for further assessment including chest x-ray.  On exam patient is well-appearing resting comfortably speaking complete sentence and appears to be in no acute discomfort.  Heart with normal rate and rhythm, lungs are clear no wheezes rales or rhonchi noted abdomen is soft and nontender throughout exam unremarkable.  Normal phonation.  Vital sign overall reassuring.  EKG shows sinus rhythm with early repull.  Chest x-ray independently viewed and interpreted me and overall reassuring, agree with radiology interpretation.  Suspect gastritis causing his symptoms.  Will discharge home with Protonix  and Carafate .  Return precaution given.  I have low suspicion for ACS  based on patient's presentation, and he is PERC negative, doubt PE.  GI cocktail has helped with the pain.     Final diagnoses:  Substernal chest pain    ED Discharge Orders          Ordered    pantoprazole  (PROTONIX ) 20 MG tablet  Daily        03/21/24 2136    sucralfate  (CARAFATE ) 1 g tablet  3 times daily with meals & bedtime        03/21/24 2136               Nivia Colon, PA-C 03/21/24 2139    Pamella Ozell LABOR, DO 03/27/24 1140

## 2024-03-21 NOTE — ED Triage Notes (Signed)
 Chest pain x 2 weeks  Cough and sore throat started this week  Neg swab at [facility] [place] per patient

## 2024-03-21 NOTE — Discharge Instructions (Signed)
 You have been evaluated for your symptoms.  Fortunately no concerning findings were noted on today's exam.  Your symptoms may be due to heartburn.  Take medication prescribed and follow-up closely with your doctor for further care.
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
## 2024-04-01 ENCOUNTER — Encounter: Payer: Self-pay | Admitting: Internal Medicine

## 2024-04-01 ENCOUNTER — Ambulatory Visit (INDEPENDENT_AMBULATORY_CARE_PROVIDER_SITE_OTHER): Payer: MEDICAID | Admitting: Internal Medicine

## 2024-04-01 VITALS — BP 110/72 | HR 82 | Temp 98.2°F | Ht 69.0 in | Wt 158.8 lb

## 2024-04-01 DIAGNOSIS — J4520 Mild intermittent asthma, uncomplicated: Secondary | ICD-10-CM

## 2024-04-01 DIAGNOSIS — K2100 Gastro-esophageal reflux disease with esophagitis, without bleeding: Secondary | ICD-10-CM | POA: Diagnosis not present

## 2024-04-01 MED ORDER — MAALOX MAX 400-400-40 MG/5ML PO SUSP: 15.0000 mL | Freq: Four times a day (QID) | ORAL | 0 refills | Status: DC | PRN

## 2024-04-01 MED ORDER — PANTOPRAZOLE SODIUM 40 MG PO TBEC: 40.0000 mg | DELAYED_RELEASE_TABLET | Freq: Every day | ORAL | 3 refills | Status: DC

## 2024-04-01 MED ORDER — SUCRALFATE 1 G PO TABS: 1.0000 g | ORAL_TABLET | Freq: Three times a day (TID) | ORAL | 0 refills | Status: DC

## 2024-04-01 NOTE — Patient Instructions (Addendum)
 It was a pleasure seeing you today! Your health and satisfaction are our top priorities.  Robert Cone, MD  VISIT SUMMARY: You came in today for a follow-up visit after experiencing severe heartburn and an asthma exacerbation. You have been having a burning sensation in your chest, especially when lying down or after eating before sleep. You also reported a sore throat, a sensation of a lump in your throat, and difficulty swallowing. Additionally, you mentioned increased use of your inhaler due to asthma symptoms, which you believe are related to weather changes.  YOUR PLAN: -GASTROESOPHAGEAL REFLUX DISEASE WITH ESOPHAGITIS: You are experiencing a severe flare-up of GERD, which is causing heartburn, a sore throat, and difficulty swallowing. GERD occurs when stomach acid frequently flows back into the tube connecting your mouth and stomach. Continue taking Protonix  as prescribed and add Maalox liquid to help coat your esophagus and aid in healing. Avoid triggers such as coffee, alcohol, and smoking. Eat small meals and avoid eating before lying down. A dietary handout will be provided with foods that are easy on the esophagus and those to avoid. If symptoms do not improve, you will be referred to a gastroenterologist for further evaluation.  -ASTHMA: Your asthma symptoms have increased slightly, leading to more frequent use of your inhaler. Asthma is a condition in which your airways narrow and swell, producing extra mucus. Continue using your inhaler as needed and monitor your symptoms. Adjust your treatment if necessary.  INSTRUCTIONS: Please follow up with a gastroenterologist if your symptoms do not improve. Continue taking your medications as prescribed and follow the dietary recommendations provided. Monitor your asthma symptoms and use your inhaler as needed.  Your Providers PCP: Benton Robert MATSU, MD,  [phone]) Referring Provider: Cone Robert MATSU, MD,  [phone])  NEXT STEPS: [x]   Early Intervention: Schedule sooner appointment, call our on-call services, or go to emergency room if there is any significant Increase in pain or discomfort New or worsening symptoms Sudden or severe changes in your health [x]  Flexible Follow-Up: We recommend a No follow-ups on file. for optimal routine care. This allows for progress monitoring and treatment adjustments. [x]  Preventive Care: Schedule your annual preventive care visit! It's typically covered by insurance and helps identify potential health issues early. [x]  Lab & X-ray Appointments: Incomplete tests scheduled today, or call to schedule. X-rays: [facility] (M-F, 8:30am-noon or 1pm-5pm). [x]  Medical Information Release: Sign a release form at front desk to obtain relevant medical information we don't have.  MAKING THE MOST OF OUR FOCUSED 20 MINUTE APPOINTMENTS: [x]   Clearly state your top concerns at the beginning of the visit to focus our discussion [x]   If you anticipate you will need more time, please inform the front desk during scheduling - we can book multiple appointments in the same week. [x]   If you have transportation problems- use our convenient video appointments or ask about transportation support. [x]   We can get down to business faster if you use MyChart to update information before the visit and submit non-urgent questions before your visit. Thank you for taking the time to provide details through MyChart.  Let our nurse know and she can import this information into your encounter documents.  Arrival and Wait Times: [x]   Arriving on time ensures that everyone receives prompt attention. [x]   Early morning (8a) and afternoon (1p) appointments tend to have shortest wait times. [x]   Unfortunately, we cannot delay appointments for late arrivals or hold slots during phone calls.  Getting  Answers and Following Up [x]   Simple Questions & Concerns: For quick questions or basic follow-up after your visit,  reach us  at [phone] or MyChart messaging. [x]   Complex Concerns: If your concern is more complex, scheduling an appointment might be best. Discuss this with the staff to find the most suitable option. [x]   Lab & Imaging Results: We'll contact you directly if results are abnormal or you don't use MyChart. Most normal results will be on MyChart within 2-3 business days, with a review message from Dr. Jesus. Haven't heard back in 2 weeks? Need results sooner? Contact us  at [phone]. [x]   Referrals: Our referral coordinator will manage specialist referrals. The specialist's office should contact you within 2 weeks to schedule an appointment. Call us  if you haven't heard from them after 2 weeks.  Staying Connected [x]   MyChart: Activate your MyChart for the fastest way to access results and message us . See the last page of this paperwork for instructions on how to activate.  Bring to Your Next Appointment [x]   Medications: Please bring all your medication bottles to your next appointment to ensure we have an accurate record of your prescriptions. [x]   Health Diaries: If you're monitoring any health conditions at home, keeping a diary of your readings can be very helpful for discussions at your next appointment.  Billing [x]   X-ray & Lab Orders: These are billed by separate companies. Contact the invoicing company directly for questions or concerns. [x]   Visit Charges: Discuss any billing inquiries with our administrative services team.  Your Satisfaction Matters [x]   Share Your Experience: We strive for your satisfaction! If you have any complaints, or preferably compliments, please let Dr. Jesus know directly or contact our Practice Administrators, Manuelita Rubin or [employer], by asking at the front desk.   Reviewing Your Records [x]   Review this early draft of your clinical encounter notes below and the final encounter summary tomorrow on MyChart after its been completed.  All  orders placed so far are visible here: Gastroesophageal reflux disease with esophagitis without hemorrhage -     Maalox Max; Take 15 mLs by mouth every 6 (six) hours as needed for indigestion.  Dispense: 355 mL; Refill: 0 -     Pantoprazole  Sodium; Take 1 tablet (40 mg total) by mouth daily.  Dispense: 30 tablet; Refill: 3 -     Sucralfate ; Take 1 tablet (1 g total) by mouth 4 (four) times daily -  with meals and at bedtime.  Dispense: 90 tablet; Refill: 0  Mild intermittent asthma, unspecified whether complicated           ?? Managing Severe Esophagitis & Swallowing Issues You are currently experiencing a flare of severe esophagitis (inflammation of the esophagus), worsened asthma symptoms, and difficulty swallowing, especially when eating or lying down. These symptoms can be very uncomfortable but often improve with the right care. This handout explains your treatment plan, what you can do to support healing, and when to seek further help.  ? Your Current Treatment Plan You are now taking: PPI (Proton Pump Inhibitor) - reduces stomach acid to help your esophagus heal Carafate  (Sucralfate ) - coats and protects the esophagus lining Maalox - helps neutralize acid and soothe irritation ?? Take these medications exactly as prescribed. It may take several days to a few weeks to feel better.  ??? Diet Changes to Help You Heal Making temporary changes to your diet is key while your esophagus heals. Follow these guidelines: ? Foods to  Eat (Gentle on Esophagus): Oatmeal, cream of wheat Mashed potatoes, boiled rice Cooked vegetables (soft and bland - like carrots, squash) Applesauce, ripe bananas Plain pasta or noodles Lean poultry/fish (baked or steamed, not spicy) Yogurt (low-acid, not citrus flavored) Herbal teas (e.g., chamomile) ?? Avoid These Foods (Can Trigger or Worsen Symptoms): Acidic foods (tomatoes, citrus fruits, vinegar) Spicy foods (hot sauce, chili) Fried or greasy  foods Caffeine (coffee, soda, energy drinks) Chocolate Mint Alcohol Carbonated drinks Hard, dry, or scratchy foods (chips, crackers, toast)  ?? Lifestyle Tips Eat small meals: Large meals put pressure on the stomach. Stay upright after eating: Don't lie down for at least 2-3 hours after meals. Elevate the head of your bed: Use blocks or a wedge pillow to keep your head higher at night. Avoid tight clothing around the waist. Stop smoking (if applicable).  ?? Track Your Symptoms Use a daily log to monitor: Pain or burning in chest/throat Swallowing difficulty Coughing or wheezing (especially at night) How food affects your symptoms Bring this log to your follow-up appointments.  ?? Next Steps A GI referral has been placed. This is important if symptoms don't improve or if more tests (like endoscopy) are needed. Follow up if symptoms worsen, new symptoms appear (e.g., vomiting, weight loss, blood in stool), or no improvement after 2-3 weeks on treatment.  ?? When to Call for Help Immediately Trouble breathing or swallowing saliva Chest pain not related to heartburn Vomiting blood or black/tarry stools Unexplained weight loss
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
## 2024-04-03 ENCOUNTER — Telehealth (HOSPITAL_COMMUNITY): Payer: Self-pay | Admitting: Psychiatry

## 2024-04-03 ENCOUNTER — Encounter (HOSPITAL_COMMUNITY): Payer: MEDICAID | Admitting: Psychiatry

## 2024-04-03 NOTE — Telephone Encounter (Signed)
 Called patient at [phone] at 10:35 AM regarding scheduled appointment today at 10:30 AM.  Patient did not answer the phone.  Left HIPAA compliant voicemail.

## 2024-04-27 ENCOUNTER — Ambulatory Visit: Payer: Self-pay | Admitting: Internal Medicine

## 2024-04-27 ENCOUNTER — Other Ambulatory Visit: Payer: Self-pay | Admitting: Internal Medicine

## 2024-04-27 DIAGNOSIS — J4530 Mild persistent asthma, uncomplicated: Secondary | ICD-10-CM

## 2024-04-27 DIAGNOSIS — K2100 Gastro-esophageal reflux disease with esophagitis, without bleeding: Secondary | ICD-10-CM

## 2024-04-27 DIAGNOSIS — F3132 Bipolar disorder, current episode depressed, moderate: Secondary | ICD-10-CM
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
## 2024-05-05 ENCOUNTER — Ambulatory Visit (INDEPENDENT_AMBULATORY_CARE_PROVIDER_SITE_OTHER): Payer: MEDICAID | Admitting: Psychiatry

## 2024-05-05 VITALS — BP 123/82 | HR 96 | Wt 153.0 lb

## 2024-05-05 DIAGNOSIS — F1211 Cannabis abuse, in remission: Secondary | ICD-10-CM

## 2024-05-05 DIAGNOSIS — F411 Generalized anxiety disorder: Secondary | ICD-10-CM | POA: Diagnosis not present

## 2024-05-05 DIAGNOSIS — F1091 Alcohol use, unspecified, in remission: Secondary | ICD-10-CM

## 2024-05-05 DIAGNOSIS — F3341 Major depressive disorder, recurrent, in partial remission: Secondary | ICD-10-CM | POA: Diagnosis not present

## 2024-05-05 DIAGNOSIS — Z720 Tobacco use: Secondary | ICD-10-CM

## 2024-05-05 MED ORDER — HYDROXYZINE HCL 25 MG PO TABS: 25.0000 mg | ORAL_TABLET | Freq: Two times a day (BID) | ORAL | 1 refills | Status: AC | PRN

## 2024-05-05 MED ORDER — FLUOXETINE HCL 20 MG PO CAPS: 20.0000 mg | ORAL_CAPSULE | Freq: Every day | ORAL | 1 refills | Status: DC

## 2024-05-05 NOTE — Telephone Encounter (Signed)
 Received CRM to request home sleep test. However, patient is not an established patient with our office and there is currently no referral.   Left voicemail for patient to contact us  to get scheduled as a new patient- sleep as well as sent a mychart message for patient to contact us .     Copied from CRM [id number]. Topic: Clinical - Request for Lab/Test Order >> May 05, 2024 11:29 AM Rilla NOVAK wrote: Reason for CRM: Patient has referral for home sleep test.  Please call patient @ [phone] to schedule.

## 2024-05-08 NOTE — Telephone Encounter (Signed)
 Patient scheduled 07/27/24 w/ Alghanim. NFN.

## 2024-05-21 ENCOUNTER — Encounter: Payer: MEDICAID | Admitting: Internal Medicine

## 2024-05-26 ENCOUNTER — Encounter: Payer: Self-pay | Admitting: Internal Medicine

## 2024-06-20 NOTE — Progress Notes (Unsigned)
 Waited on video call for 10 min for patient and sent 2 reminder to patient's phone.  Called patient regarding scheduled appointment today at [phone]  at 10:15am, no answer  Left HIPAA compliant voicemail with front desk # [phone]) for patient to reschedule appointment

## 2024-06-25 ENCOUNTER — Telehealth (INDEPENDENT_AMBULATORY_CARE_PROVIDER_SITE_OTHER): Payer: MEDICAID | Admitting: Psychiatry

## 2024-06-25 DIAGNOSIS — Z91199 Patient's noncompliance with other medical treatment and regimen due to unspecified reason: Secondary | ICD-10-CM
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
## 2024-07-27 ENCOUNTER — Encounter: Payer: Self-pay | Admitting: Pulmonary Disease

## 2024-07-27 ENCOUNTER — Ambulatory Visit (INDEPENDENT_AMBULATORY_CARE_PROVIDER_SITE_OTHER): Payer: MEDICAID | Admitting: Pulmonary Disease

## 2024-07-27 VITALS — BP 113/72 | HR 81 | Ht 69.0 in | Wt 140.0 lb

## 2024-07-27 DIAGNOSIS — Z87891 Personal history of nicotine dependence: Secondary | ICD-10-CM

## 2024-07-27 DIAGNOSIS — G4710 Hypersomnia, unspecified: Secondary | ICD-10-CM

## 2024-07-27 DIAGNOSIS — G4730 Sleep apnea, unspecified: Secondary | ICD-10-CM

## 2024-07-27 DIAGNOSIS — J4530 Mild persistent asthma, uncomplicated: Secondary | ICD-10-CM | POA: Diagnosis not present

## 2024-07-27 DIAGNOSIS — R0683 Snoring: Secondary | ICD-10-CM | POA: Diagnosis not present

## 2024-07-27 MED ORDER — BUDESONIDE-FORMOTEROL FUMARATE 80-4.5 MCG/ACT IN AERO: 2.0000 | INHALATION_SPRAY | Freq: Two times a day (BID) | RESPIRATORY_TRACT | 6 refills | Status: AC

## 2024-07-27 NOTE — Patient Instructions (Signed)
" °  VISIT SUMMARY: You visited us  today due to sleep disturbances and daytime fatigue. We discussed your symptoms, including snoring, daytime sleepiness, and nocturnal awakenings, and we suspect obstructive sleep apnea. We also reviewed your asthma management and recent exacerbation.  YOUR PLAN: SLEEP-DISORDERED BREATHING (SUSPECTED OBSTRUCTIVE SLEEP APNEA): You have symptoms like snoring, daytime sleepiness, and waking up at night, which may indicate obstructive sleep apnea. You also have a family history of this condition. -We have ordered a home sleep study to evaluate for obstructive sleep apnea. -We will schedule a follow-up appointment in three months to review the sleep study results.  MODERATE PERSISTENT ASTHMA: Your asthma is triggered by cold air and seasonal changes. You recently had an exacerbation due to a lapse in medication access. -Continue using Symbicort , two puffs in the morning and two puffs in the evening. Remember to rinse your mouth after use. -Use your albuterol  inhaler as needed. -Make sure to clean your Symbicort  inhaler regularly to prevent clogging. -We have sent a prescription for Symbicort  to your pharmacy.   Contains text generated by [employer].   "

## 2024-07-27 NOTE — Assessment & Plan Note (Signed)
 Mild persistent asthma Exacerbations triggered by cold air and seasonal changes. Managed with Symbicort  and albuterol  inhaler. Recent exacerbation due to insurance lapse affecting medication access. - Continue Symbicort , two puffs in the morning and two puffs in the evening, with mouth rinsing after use. - Use albuterol  inhaler as needed. - Educated on cleaning Symbicort  inhaler to prevent clogging. - Sent prescription for Symbicort  to pharmacy.
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
## 2024-08-11 ENCOUNTER — Ambulatory Visit: Payer: Self-pay | Admitting: Family Medicine

## 2024-08-11 ENCOUNTER — Telehealth (HOSPITAL_COMMUNITY): Payer: MEDICAID | Admitting: Psychiatry

## 2024-08-11 ENCOUNTER — Encounter: Payer: Self-pay | Admitting: Family Medicine

## 2024-08-11 ENCOUNTER — Ambulatory Visit: Payer: MEDICAID | Admitting: Family Medicine

## 2024-08-11 VITALS — BP 111/71 | HR 69 | Temp 97.3°F | Ht 69.0 in | Wt 143.2 lb

## 2024-08-11 DIAGNOSIS — Z720 Tobacco use: Secondary | ICD-10-CM | POA: Diagnosis not present

## 2024-08-11 DIAGNOSIS — F3132 Bipolar disorder, current episode depressed, moderate: Secondary | ICD-10-CM

## 2024-08-11 DIAGNOSIS — J4530 Mild persistent asthma, uncomplicated: Secondary | ICD-10-CM | POA: Diagnosis not present

## 2024-08-11 DIAGNOSIS — F331 Major depressive disorder, recurrent, moderate: Secondary | ICD-10-CM

## 2024-08-11 DIAGNOSIS — F1091 Alcohol use, unspecified, in remission: Secondary | ICD-10-CM

## 2024-08-11 DIAGNOSIS — R45851 Suicidal ideations: Secondary | ICD-10-CM

## 2024-08-11 DIAGNOSIS — R7303 Prediabetes: Secondary | ICD-10-CM | POA: Diagnosis not present

## 2024-08-11 DIAGNOSIS — F3341 Major depressive disorder, recurrent, in partial remission: Secondary | ICD-10-CM

## 2024-08-11 DIAGNOSIS — F1211 Cannabis abuse, in remission: Secondary | ICD-10-CM

## 2024-08-11 DIAGNOSIS — F411 Generalized anxiety disorder: Secondary | ICD-10-CM

## 2024-08-11 DIAGNOSIS — E559 Vitamin D deficiency, unspecified: Secondary | ICD-10-CM

## 2024-08-11 DIAGNOSIS — K219 Gastro-esophageal reflux disease without esophagitis: Secondary | ICD-10-CM | POA: Diagnosis not present

## 2024-08-11 LAB — BAYER DCA HB A1C WAIVED: HB A1C (BAYER DCA - WAIVED): 5.4 % (ref 4.8–5.6)

## 2024-08-11 MED ORDER — FLUOXETINE HCL 20 MG PO CAPS: 20.0000 mg | ORAL_CAPSULE | Freq: Every day | ORAL | 0 refills | Status: AC

## 2024-08-11 MED ORDER — OXCARBAZEPINE 150 MG PO TABS: 150.0000 mg | ORAL_TABLET | Freq: Two times a day (BID) | ORAL | 0 refills | Status: AC

## 2024-08-11 MED ORDER — HYDROXYZINE HCL 25 MG PO TABS: 25.0000 mg | ORAL_TABLET | Freq: Two times a day (BID) | ORAL | 0 refills | Status: AC | PRN

## 2024-08-11 MED ORDER — ALBUTEROL SULFATE HFA 108 (90 BASE) MCG/ACT IN AERS: 2.0000 | INHALATION_SPRAY | RESPIRATORY_TRACT | 11 refills | Status: AC | PRN

## 2024-08-11 NOTE — Addendum Note (Signed)
 Addended by: GRAHAM KRABBE on: 08/11/2024 11:47 AM   Modules accepted: Level of Service
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
## 2024-08-11 NOTE — Patient Instructions (Addendum)
 Behavioral Health Urgent Care  Address: [address], [place], [place] [address] Hours:  Open 24 hours Phone: [phone]     Your provider wants you to schedule an appointment with a Psychologist/Psychiatrist. The following list of offices requires the patient to call and make their own appointment, as there is information they need that only you can provide. Please feel free to choose form the following providers:  [facility]   [phone] Crisis Recovery in [place] [phone]  [facility]  [phone] [place], [place]  (Scheduled through [facility]) Must call and do an interview for appointment. Sees Children / Accepts Medicaid  [facility]    [id number]  [address]    [place], [place]       [facility]  [phone] [address] [place], [place]  Evaluates for Autism but does not treat it Sees Children / Accepts Medicaid  [facility]    [phone] [address]   [place], [place] Medication management, substance abuse, bipolar, grief, family, marriage, OCD, anxiety, PTSD Sees children / Accepts Medicaid  [facility]    [phone] [address] [place], [place] Sees children / Accepts [facility]  [facility]  [phone] [address] [place], [place]   Dr Kirkwood     [phone] [address] [place], [place]  Sees ADD & ADHD for treatment Accepts Medicaid  [facility]  [phone] [address] [place], [place] Evaluates for Autism Accepts [facility]  [facility]  [phone] [address] [place], [place]  Does Adult ADD evaluations Does not accept Medicaid  [facility]   [phone] [address] [place], [place] Uses animal therapy  Sees children as young as 73 years old Accepts [facility]     [phone]    [address]  [place], [place] [address] Sees children Accepts Medicaid

## 2024-08-12 DIAGNOSIS — E559 Vitamin D deficiency, unspecified: Secondary | ICD-10-CM | POA: Insufficient documentation

## 2024-08-12 LAB — VITAMIN D 25 HYDROXY (VIT D DEFICIENCY, FRACTURES): Vit D, 25-Hydroxy: 12 ng/mL — ABNORMAL LOW (ref 30.0–100.0)

## 2024-08-12 LAB — CMP14+EGFR
ALT: 21 IU/L (ref 0–44)
AST: 16 IU/L (ref 0–40)
Albumin: 4.5 g/dL (ref 4.1–5.1)
Alkaline Phosphatase: 101 IU/L (ref 47–123)
BUN/Creatinine Ratio: 17 (ref 9–20)
BUN: 16 mg/dL (ref 6–20)
Bilirubin Total: 0.3 mg/dL (ref 0.0–1.2)
CO2: 23 mmol/L (ref 20–29)
Calcium: 9.3 mg/dL (ref 8.7–10.2)
Chloride: 101 mmol/L (ref 96–106)
Creatinine, Ser: 0.93 mg/dL (ref 0.76–1.27)
Globulin, Total: 2.6 g/dL (ref 1.5–4.5)
Glucose: 94 mg/dL (ref 70–99)
Potassium: 4.6 mmol/L (ref 3.5–5.2)
Sodium: 139 mmol/L (ref 134–144)
Total Protein: 7.1 g/dL (ref 6.0–8.5)
eGFR: 112 mL/min/1.73

## 2024-08-12 LAB — CBC WITH DIFFERENTIAL/PLATELET
Basophils Absolute: 0.1 x10E3/uL (ref 0.0–0.2)
Basos: 1 %
EOS (ABSOLUTE): 0.9 x10E3/uL — ABNORMAL HIGH (ref 0.0–0.4)
Eos: 11 %
Hematocrit: 46.7 % (ref 37.5–51.0)
Hemoglobin: 15.6 g/dL (ref 13.0–17.7)
Immature Grans (Abs): 0 x10E3/uL (ref 0.0–0.1)
Immature Granulocytes: 0 %
Lymphocytes Absolute: 2.6 x10E3/uL (ref 0.7–3.1)
Lymphs: 32 %
MCH: 28.2 pg (ref 26.6–33.0)
MCHC: 33.4 g/dL (ref 31.5–35.7)
MCV: 84 fL (ref 79–97)
Monocytes Absolute: 0.6 x10E3/uL (ref 0.1–0.9)
Monocytes: 8 %
Neutrophils Absolute: 3.7 x10E3/uL (ref 1.4–7.0)
Neutrophils: 48 %
Platelets: 288 x10E3/uL (ref 150–450)
RBC: 5.53 x10E6/uL (ref 4.14–5.80)
RDW: 13.9 % (ref 11.6–15.4)
WBC: 7.9 x10E3/uL (ref 3.4–10.8)

## 2024-08-12 LAB — LIPID PANEL
Chol/HDL Ratio: 2.9 ratio (ref 0.0–5.0)
Cholesterol, Total: 172 mg/dL (ref 100–199)
HDL: 59 mg/dL
LDL Chol Calc (NIH): 98 mg/dL (ref 0–99)
Triglycerides: 80 mg/dL (ref 0–149)
VLDL Cholesterol Cal: 15 mg/dL (ref 5–40)

## 2024-08-12 LAB — T4, FREE: Free T4: 0.93 ng/dL (ref 0.82–1.77)

## 2024-08-12 LAB — TSH: TSH: 1.53 u[IU]/mL (ref 0.450–4.500)

## 2024-08-12 LAB — VITAMIN B12: Vitamin B-12: 584 pg/mL (ref 232–1245)

## 2024-08-12 MED ORDER — VITAMIN D (ERGOCALCIFEROL) 1.25 MG (50000 UNIT) PO CAPS: 50000.0000 [IU] | ORAL_CAPSULE | ORAL | 0 refills | Status: AC

## 2024-09-01 ENCOUNTER — Ambulatory Visit: Payer: MEDICAID | Admitting: Gastroenterology

## 2024-10-26 ENCOUNTER — Ambulatory Visit: Payer: MEDICAID | Admitting: Pulmonary Disease

## 2025-02-09 ENCOUNTER — Encounter: Payer: MEDICAID | Admitting: Family Medicine
